# Patient Record
Sex: Male | Born: 1950 | Race: White | Hispanic: No | Marital: Married | Smoking: Current every day smoker
Health system: Southern US, Community
[De-identification: ages and names within clinical notes are randomized; demographics above are authoritative.]

## PROBLEM LIST (undated history)

## (undated) DIAGNOSIS — E119 Type 2 diabetes mellitus without complications: Secondary | ICD-10-CM

## (undated) DIAGNOSIS — J449 Chronic obstructive pulmonary disease, unspecified: Secondary | ICD-10-CM

## (undated) DIAGNOSIS — R062 Wheezing: Secondary | ICD-10-CM

## (undated) DIAGNOSIS — H9190 Unspecified hearing loss, unspecified ear: Secondary | ICD-10-CM

## (undated) DIAGNOSIS — T7840XA Allergy, unspecified, initial encounter: Secondary | ICD-10-CM

## (undated) DIAGNOSIS — R42 Dizziness and giddiness: Secondary | ICD-10-CM

## (undated) DIAGNOSIS — M1990 Unspecified osteoarthritis, unspecified site: Secondary | ICD-10-CM

## (undated) DIAGNOSIS — I219 Acute myocardial infarction, unspecified: Secondary | ICD-10-CM

## (undated) DIAGNOSIS — F32A Depression, unspecified: Secondary | ICD-10-CM

## (undated) DIAGNOSIS — R0601 Orthopnea: Secondary | ICD-10-CM

## (undated) DIAGNOSIS — F329 Major depressive disorder, single episode, unspecified: Secondary | ICD-10-CM

## (undated) DIAGNOSIS — C801 Malignant (primary) neoplasm, unspecified: Secondary | ICD-10-CM

## (undated) DIAGNOSIS — I639 Cerebral infarction, unspecified: Secondary | ICD-10-CM

## (undated) DIAGNOSIS — I2510 Atherosclerotic heart disease of native coronary artery without angina pectoris: Secondary | ICD-10-CM

## (undated) DIAGNOSIS — K219 Gastro-esophageal reflux disease without esophagitis: Secondary | ICD-10-CM

## (undated) HISTORY — PX: CORONARY ANGIOPLASTY: SHX604

## (undated) HISTORY — PX: EYE SURGERY: SHX253

## (undated) HISTORY — PX: ADRENAL GLAND SURGERY: SHX544

## (undated) HISTORY — PX: HAND SURGERY: SHX662

## (undated) HISTORY — DX: Allergy, unspecified, initial encounter: T78.40XA

---

## 2003-02-17 ENCOUNTER — Other Ambulatory Visit: Payer: Self-pay

## 2003-12-28 ENCOUNTER — Emergency Department: Payer: Self-pay | Admitting: Emergency Medicine

## 2004-03-01 ENCOUNTER — Ambulatory Visit: Payer: Self-pay | Admitting: Internal Medicine

## 2004-10-25 ENCOUNTER — Other Ambulatory Visit: Payer: Self-pay

## 2004-10-25 ENCOUNTER — Emergency Department: Payer: Self-pay | Admitting: Emergency Medicine

## 2006-01-06 ENCOUNTER — Ambulatory Visit: Payer: Self-pay | Admitting: *Deleted

## 2006-07-03 ENCOUNTER — Ambulatory Visit: Payer: Self-pay | Admitting: Family Medicine

## 2006-07-04 ENCOUNTER — Ambulatory Visit: Payer: Self-pay | Admitting: Family Medicine

## 2006-07-09 ENCOUNTER — Ambulatory Visit: Payer: Self-pay | Admitting: Family Medicine

## 2006-07-11 ENCOUNTER — Ambulatory Visit: Payer: Self-pay | Admitting: Gastroenterology

## 2006-07-12 ENCOUNTER — Ambulatory Visit: Payer: Self-pay | Admitting: Family Medicine

## 2006-07-18 ENCOUNTER — Ambulatory Visit: Payer: Self-pay | Admitting: Family Medicine

## 2006-08-08 ENCOUNTER — Ambulatory Visit: Payer: Self-pay | Admitting: Family Medicine

## 2006-09-08 ENCOUNTER — Ambulatory Visit: Payer: Self-pay | Admitting: Family Medicine

## 2007-05-12 ENCOUNTER — Emergency Department: Payer: Self-pay | Admitting: Emergency Medicine

## 2007-05-16 ENCOUNTER — Emergency Department: Payer: Self-pay | Admitting: Emergency Medicine

## 2007-05-26 ENCOUNTER — Emergency Department: Payer: Self-pay | Admitting: Emergency Medicine

## 2008-08-19 ENCOUNTER — Ambulatory Visit: Payer: Self-pay | Admitting: Family Medicine

## 2009-02-08 ENCOUNTER — Emergency Department: Payer: Self-pay | Admitting: Emergency Medicine

## 2009-06-09 ENCOUNTER — Ambulatory Visit: Payer: Self-pay | Admitting: Family Medicine

## 2010-04-06 ENCOUNTER — Ambulatory Visit: Payer: Self-pay | Admitting: Family Medicine

## 2010-04-08 ENCOUNTER — Ambulatory Visit: Payer: Self-pay | Admitting: Family Medicine

## 2010-05-09 ENCOUNTER — Ambulatory Visit: Payer: Self-pay | Admitting: Family Medicine

## 2010-06-08 ENCOUNTER — Ambulatory Visit: Payer: Self-pay | Admitting: Family Medicine

## 2010-08-18 DIAGNOSIS — Z720 Tobacco use: Secondary | ICD-10-CM | POA: Insufficient documentation

## 2010-10-12 DIAGNOSIS — E113499 Type 2 diabetes mellitus with severe nonproliferative diabetic retinopathy without macular edema, unspecified eye: Secondary | ICD-10-CM | POA: Insufficient documentation

## 2011-03-15 DIAGNOSIS — I10 Essential (primary) hypertension: Secondary | ICD-10-CM | POA: Insufficient documentation

## 2011-03-15 DIAGNOSIS — I2510 Atherosclerotic heart disease of native coronary artery without angina pectoris: Secondary | ICD-10-CM | POA: Insufficient documentation

## 2011-09-08 DIAGNOSIS — Z0289 Encounter for other administrative examinations: Secondary | ICD-10-CM | POA: Insufficient documentation

## 2012-01-25 DIAGNOSIS — G4733 Obstructive sleep apnea (adult) (pediatric): Secondary | ICD-10-CM | POA: Insufficient documentation

## 2012-05-16 DIAGNOSIS — R918 Other nonspecific abnormal finding of lung field: Secondary | ICD-10-CM | POA: Insufficient documentation

## 2012-05-16 DIAGNOSIS — I252 Old myocardial infarction: Secondary | ICD-10-CM | POA: Insufficient documentation

## 2012-05-16 DIAGNOSIS — I77810 Thoracic aortic ectasia: Secondary | ICD-10-CM | POA: Insufficient documentation

## 2012-09-27 DIAGNOSIS — M1990 Unspecified osteoarthritis, unspecified site: Secondary | ICD-10-CM | POA: Insufficient documentation

## 2013-07-23 DIAGNOSIS — F32A Depression, unspecified: Secondary | ICD-10-CM | POA: Insufficient documentation

## 2013-07-23 DIAGNOSIS — F329 Major depressive disorder, single episode, unspecified: Secondary | ICD-10-CM | POA: Insufficient documentation

## 2014-07-23 NOTE — Patient Outreach (Signed)
[facility]) Care Management  07/23/2014  Sergio Vargas [birth year] [id number]   I called [name] and his wife to schedule appointments.  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 I have discharged him.   Gentry Fitz, RN, BA, MHA, CDE Diabetes Coordinator Inpatient Diabetes Program  [phone] (Team Pager) [phone] ([facility]) 07/23/2014 10:02 AM

## 2014-09-26 DIAGNOSIS — J45909 Unspecified asthma, uncomplicated: Secondary | ICD-10-CM | POA: Insufficient documentation

## 2015-02-28 DIAGNOSIS — J449 Chronic obstructive pulmonary disease, unspecified: Secondary | ICD-10-CM | POA: Diagnosis not present

## 2015-02-28 DIAGNOSIS — G459 Transient cerebral ischemic attack, unspecified: Secondary | ICD-10-CM | POA: Diagnosis not present

## 2015-02-28 DIAGNOSIS — M542 Cervicalgia: Secondary | ICD-10-CM | POA: Diagnosis not present

## 2015-03-20 DIAGNOSIS — J449 Chronic obstructive pulmonary disease, unspecified: Secondary | ICD-10-CM | POA: Insufficient documentation

## 2015-03-31 DIAGNOSIS — J449 Chronic obstructive pulmonary disease, unspecified: Secondary | ICD-10-CM | POA: Diagnosis not present

## 2015-03-31 DIAGNOSIS — G459 Transient cerebral ischemic attack, unspecified: Secondary | ICD-10-CM | POA: Diagnosis not present

## 2015-03-31 DIAGNOSIS — M5030 Other cervical disc degeneration, unspecified cervical region: Secondary | ICD-10-CM | POA: Diagnosis not present

## 2015-03-31 DIAGNOSIS — M542 Cervicalgia: Secondary | ICD-10-CM | POA: Diagnosis not present

## 2015-04-06 ENCOUNTER — Encounter: Payer: Self-pay | Admitting: *Deleted

## 2015-04-07 ENCOUNTER — Encounter: Payer: Self-pay | Admitting: *Deleted

## 2015-04-07 ENCOUNTER — Ambulatory Visit
Admission: RE | Admit: 2015-04-07 | Discharge: 2015-04-07 | Disposition: A | Discharge status: Home / Self Care | Payer: Medicare HMO | Source: Ambulatory Visit | Attending: Ophthalmology | Admitting: Ophthalmology

## 2015-04-07 ENCOUNTER — Ambulatory Visit: Payer: Medicare HMO | Admitting: Anesthesiology

## 2015-04-07 ENCOUNTER — Encounter
Admission: RE | Disposition: A | Discharge status: Home / Self Care | Payer: Self-pay | Source: Ambulatory Visit | Attending: Ophthalmology

## 2015-04-07 DIAGNOSIS — H9190 Unspecified hearing loss, unspecified ear: Secondary | ICD-10-CM | POA: Insufficient documentation

## 2015-04-07 DIAGNOSIS — Z85850 Personal history of malignant neoplasm of thyroid: Secondary | ICD-10-CM | POA: Diagnosis not present

## 2015-04-07 DIAGNOSIS — Z885 Allergy status to narcotic agent status: Secondary | ICD-10-CM | POA: Insufficient documentation

## 2015-04-07 DIAGNOSIS — Z888 Allergy status to other drugs, medicaments and biological substances status: Secondary | ICD-10-CM | POA: Insufficient documentation

## 2015-04-07 DIAGNOSIS — K219 Gastro-esophageal reflux disease without esophagitis: Secondary | ICD-10-CM | POA: Diagnosis not present

## 2015-04-07 DIAGNOSIS — M1990 Unspecified osteoarthritis, unspecified site: Secondary | ICD-10-CM | POA: Diagnosis not present

## 2015-04-07 DIAGNOSIS — J449 Chronic obstructive pulmonary disease, unspecified: Secondary | ICD-10-CM | POA: Insufficient documentation

## 2015-04-07 DIAGNOSIS — Z8673 Personal history of transient ischemic attack (TIA), and cerebral infarction without residual deficits: Secondary | ICD-10-CM | POA: Diagnosis not present

## 2015-04-07 DIAGNOSIS — Z720 Tobacco use: Secondary | ICD-10-CM | POA: Diagnosis not present

## 2015-04-07 DIAGNOSIS — H2512 Age-related nuclear cataract, left eye: Secondary | ICD-10-CM | POA: Insufficient documentation

## 2015-04-07 DIAGNOSIS — I252 Old myocardial infarction: Secondary | ICD-10-CM | POA: Insufficient documentation

## 2015-04-07 DIAGNOSIS — Z955 Presence of coronary angioplasty implant and graft: Secondary | ICD-10-CM | POA: Insufficient documentation

## 2015-04-07 DIAGNOSIS — I2510 Atherosclerotic heart disease of native coronary artery without angina pectoris: Secondary | ICD-10-CM | POA: Diagnosis not present

## 2015-04-07 DIAGNOSIS — I10 Essential (primary) hypertension: Secondary | ICD-10-CM | POA: Diagnosis not present

## 2015-04-07 HISTORY — DX: Dizziness and giddiness: R42

## 2015-04-07 HISTORY — DX: Atherosclerotic heart disease of native coronary artery without angina pectoris: I25.10

## 2015-04-07 HISTORY — DX: Major depressive disorder, single episode, unspecified: F32.9

## 2015-04-07 HISTORY — DX: Cerebral infarction, unspecified: I63.9

## 2015-04-07 HISTORY — DX: Depression, unspecified: F32.A

## 2015-04-07 HISTORY — DX: Unspecified osteoarthritis, unspecified site: M19.90

## 2015-04-07 HISTORY — DX: Orthopnea: R06.01

## 2015-04-07 HISTORY — DX: Chronic obstructive pulmonary disease, unspecified: J44.9

## 2015-04-07 HISTORY — DX: Gastro-esophageal reflux disease without esophagitis: K21.9

## 2015-04-07 HISTORY — PX: CATARACT EXTRACTION W/PHACO: SHX586

## 2015-04-07 HISTORY — DX: Wheezing: R06.2

## 2015-04-07 HISTORY — DX: Malignant (primary) neoplasm, unspecified: C80.1

## 2015-04-07 HISTORY — DX: Unspecified hearing loss, unspecified ear: H91.90

## 2015-04-07 HISTORY — DX: Acute myocardial infarction, unspecified: I21.9

## 2015-04-07 HISTORY — DX: Type 2 diabetes mellitus without complications: E11.9

## 2015-04-07 LAB — GLUCOSE, CAPILLARY
GLUCOSE-CAPILLARY: 312 mg/dL — AB (ref 65–99)
Glucose-Capillary: 367 mg/dL — ABNORMAL HIGH (ref 65–99)
Glucose-Capillary: 327 mg/dL — ABNORMAL HIGH (ref 65–99)

## 2015-04-07 SURGERY — PHACOEMULSIFICATION, CATARACT, WITH IOL INSERTION
Anesthesia: Monitor Anesthesia Care | Site: Eye | Laterality: Left | Wound class: Clean

## 2015-04-07 MED ORDER — POVIDONE-IODINE 5 % OP SOLN
1.0000 "application " | OPHTHALMIC | Status: AC | PRN
  Administered 2015-04-07: 1 via OPHTHALMIC

## 2015-04-07 MED ORDER — TETRACAINE HCL 0.5 % OP SOLN
OPHTHALMIC | Status: AC
  Administered 2015-04-07: 1 [drp] via OPHTHALMIC
  Filled 2015-04-07: qty 2

## 2015-04-07 MED ORDER — ARMC OPHTHALMIC DILATING GEL
OPHTHALMIC | Status: AC
  Administered 2015-04-07: 1 via OPHTHALMIC
  Filled 2015-04-07: qty 0.25

## 2015-04-07 MED ORDER — LIDOCAINE HCL (PF) 4 % IJ SOLN
INTRAOCULAR | Status: DC | PRN
  Administered 2015-04-07: .5 mL via OPHTHALMIC

## 2015-04-07 MED ORDER — EPINEPHRINE HCL 1 MG/ML IJ SOLN
INTRAOCULAR | Status: DC | PRN
  Administered 2015-04-07: 1 mL via OPHTHALMIC

## 2015-04-07 MED ORDER — NA CHONDROIT SULF-NA HYALURON 40-17 MG/ML IO SOLN
INTRAOCULAR | Status: DC | PRN
  Administered 2015-04-07: 1 mL via INTRAOCULAR

## 2015-04-07 MED ORDER — NA CHONDROIT SULF-NA HYALURON 40-17 MG/ML IO SOLN
INTRAOCULAR | Status: AC
  Filled 2015-04-07: qty 1

## 2015-04-07 MED ORDER — INSULIN ASPART 100 UNIT/ML ~~LOC~~ SOLN
SUBCUTANEOUS | Status: AC
  Administered 2015-04-07: 5 [IU] via SUBCUTANEOUS
  Filled 2015-04-07: qty 5

## 2015-04-07 MED ORDER — MIDAZOLAM HCL 2 MG/2ML IJ SOLN
INTRAMUSCULAR | Status: DC | PRN
  Administered 2015-04-07: 1 mg via INTRAVENOUS

## 2015-04-07 MED ORDER — INSULIN ASPART 100 UNIT/ML ~~LOC~~ SOLN
5.0000 [IU] | Freq: Once | SUBCUTANEOUS | Status: AC
  Administered 2015-04-07: 5 [IU] via SUBCUTANEOUS

## 2015-04-07 MED ORDER — TETRACAINE HCL 0.5 % OP SOLN
1.0000 [drp] | OPHTHALMIC | Status: AC | PRN
  Administered 2015-04-07: 1 [drp] via OPHTHALMIC

## 2015-04-07 MED ORDER — LIDOCAINE HCL (PF) 4 % IJ SOLN
INTRAMUSCULAR | Status: AC
  Filled 2015-04-07: qty 5

## 2015-04-07 MED ORDER — INSULIN REGULAR HUMAN 100 UNIT/ML IJ SOLN
4.0000 [IU] | Freq: Once | INTRAMUSCULAR | Status: AC
  Administered 2015-04-07: 4 [IU] via SUBCUTANEOUS
  Filled 2015-04-07: qty 0.04

## 2015-04-07 MED ORDER — MOXIFLOXACIN HCL 0.5 % OP SOLN
OPHTHALMIC | Status: DC | PRN
  Administered 2015-04-07: 1 [drp] via OPHTHALMIC

## 2015-04-07 MED ORDER — MOXIFLOXACIN HCL 0.5 % OP SOLN: 1.0000 [drp] | OPHTHALMIC | Status: DC | PRN

## 2015-04-07 MED ORDER — CEFUROXIME OPHTHALMIC INJECTION 1 MG/0.1 ML
INJECTION | OPHTHALMIC | Status: AC
  Filled 2015-04-07: qty 0.1

## 2015-04-07 MED ORDER — EPINEPHRINE HCL 1 MG/ML IJ SOLN
INTRAMUSCULAR | Status: AC
  Filled 2015-04-07: qty 2

## 2015-04-07 MED ORDER — FENTANYL CITRATE (PF) 100 MCG/2ML IJ SOLN
INTRAMUSCULAR | Status: DC | PRN
  Administered 2015-04-07: 50 ug via INTRAVENOUS

## 2015-04-07 MED ORDER — ARMC OPHTHALMIC DILATING GEL
1.0000 "application " | OPHTHALMIC | Status: DC | PRN
  Administered 2015-04-07: 1 via OPHTHALMIC

## 2015-04-07 MED ORDER — MOXIFLOXACIN HCL 0.5 % OP SOLN
OPHTHALMIC | Status: AC
Start: 2015-04-07 — End: 2015-04-07
  Filled 2015-04-07: qty 3

## 2015-04-07 MED ORDER — CARBACHOL 0.01 % IO SOLN
INTRAOCULAR | Status: DC | PRN
  Administered 2015-04-07: .5 mL via INTRAOCULAR

## 2015-04-07 MED ORDER — SODIUM CHLORIDE 0.9 % IV SOLN
INTRAVENOUS | Status: DC
  Administered 2015-04-07: 08:00:00 via INTRAVENOUS

## 2015-04-07 MED ORDER — POVIDONE-IODINE 5 % OP SOLN
OPHTHALMIC | Status: AC
  Administered 2015-04-07: 1 via OPHTHALMIC
  Filled 2015-04-07: qty 30

## 2015-04-07 MED ORDER — CEFUROXIME OPHTHALMIC INJECTION 1 MG/0.1 ML
INJECTION | OPHTHALMIC | Status: DC | PRN
  Administered 2015-04-07: .1 mL via INTRACAMERAL

## 2015-04-07 SURGICAL SUPPLY — 22 items
CANNULA ANT/CHMB 27GA (MISCELLANEOUS) ×3 IMPLANT
CUP MEDICINE 2OZ PLAST GRAD ST (MISCELLANEOUS) ×3 IMPLANT
GLOVE BIO SURGEON STRL SZ8 (GLOVE) ×3 IMPLANT
GLOVE BIOGEL M 6.5 STRL (GLOVE) ×3 IMPLANT
GLOVE SURG LX 8.0 MICRO (GLOVE) ×2
GLOVE SURG LX STRL 8.0 MICRO (GLOVE) ×1 IMPLANT
GOWN STRL REUS W/ TWL LRG LVL3 (GOWN DISPOSABLE) ×2 IMPLANT
GOWN STRL REUS W/TWL LRG LVL3 (GOWN DISPOSABLE) ×4
LENS IOL TECNIS 22.0 (Intraocular Lens) ×3 IMPLANT
LENS IOL TECNIS MONO 1P 22.0 (Intraocular Lens) ×1 IMPLANT
PACK CATARACT (MISCELLANEOUS) ×3 IMPLANT
PACK CATARACT BRASINGTON LX (MISCELLANEOUS) ×3 IMPLANT
PACK EYE AFTER SURG (MISCELLANEOUS) ×3 IMPLANT
SOL BSS BAG (MISCELLANEOUS) ×3
SOL PREP PVP 2OZ (MISCELLANEOUS) ×3
SOLUTION BSS BAG (MISCELLANEOUS) ×1 IMPLANT
SOLUTION PREP PVP 2OZ (MISCELLANEOUS) ×1 IMPLANT
SYR 3ML LL SCALE MARK (SYRINGE) ×3 IMPLANT
SYR 5ML LL (SYRINGE) ×3 IMPLANT
SYR TB 1ML 27GX1/2 LL (SYRINGE) ×3 IMPLANT
WATER STERILE IRR 1000ML POUR (IV SOLUTION) ×3 IMPLANT
WIPE NON LINTING 3.25X3.25 (MISCELLANEOUS) ×3 IMPLANT

## 2015-04-07 NOTE — Discharge Instructions (Signed)
AMBULATORY SURGERY  °DISCHARGE INSTRUCTIONS ° ° °1) The drugs that you were given will stay in your system until tomorrow so for the next 24 hours you should not: ° °A) Drive an automobile °B) Make any legal decisions °C) Drink any alcoholic beverage ° ° °2) You may resume regular meals tomorrow.  Today it is better to start with liquids and gradually work up to solid foods. ° °You may eat anything you prefer, but it is better to start with liquids, then soup and crackers, and gradually work up to solid foods. ° ° °3) Please notify your doctor immediately if you have any unusual bleeding, trouble breathing, redness and pain at the surgery site, drainage, fever, or pain not relieved by medication. ° ° ° °4) Additional Instructions: ° ° ° °Eye Surgery Discharge Instructions ° °Expect mild scratchy sensation or mild soreness. °DO NOT RUB YOUR EYE! ° °The day of surgery: °• Minimal physical activity, but bed rest is not required °• No reading, computer work, or close hand work °• No bending, lifting, or straining. °• May watch TV ° °For 24 hours: °• No driving, legal decisions, or alcoholic beverages °• Safety precautions °• Eat anything you prefer: It is better to start with liquids, then soup then solid foods. °• _____ Eye patch should be worn until postoperative exam tomorrow. °• ____ Solar shield eyeglasses should be worn for comfort in the sunlight/patch while sleeping ° °Resume all regular medications including aspirin or Coumadin if these were discontinued prior to surgery. °You may shower, bathe, shave, or wash your hair. °Tylenol may be taken for mild discomfort. ° °Call your doctor if you experience significant pain, nausea, or vomiting, fever > 101 or other signs of infection. [phone] or [phone] °Specific instructions: ° ° ° ° ° °Please contact your physician with any problems or Same Day Surgery at [phone], Monday through Friday 6 am to 4 pm, or [place] [facility] [place] Main number at  [phone]. °

## 2015-04-07 NOTE — Transfer of Care (Signed)
Immediate Anesthesia Transfer of Care Note  Patient: Sergio Vargas  Procedure(s) Performed: Procedure(s) with comments: CATARACT EXTRACTION PHACO AND INTRAOCULAR LENS PLACEMENT (IOC) (Left) - [place] 00:41 AP% 22.9 CDE 9.52 fluid pack lot [id number]  Patient Location: PACU  Anesthesia Type:MAC  Level of Consciousness: awake, alert  and oriented  Airway & Oxygen Therapy: Patient Spontanous Breathing  Post-op Assessment: Report given to RN and Post -op Vital signs reviewed and stable  Post vital signs: Reviewed and stable  Last Vitals:  Filed Vitals:   04/07/15 0718  BP: 130/80  Pulse: 84  Temp: 36.5 C  Resp: 18    Complications: No apparent anesthesia complications

## 2015-04-07 NOTE — Anesthesia Preprocedure Evaluation (Addendum)
Anesthesia Evaluation  Patient identified by MRN, date of birth, ID band Patient awake    Reviewed: Allergy & Precautions, H&P , NPO status , Patient's Chart, lab work & pertinent test results, reviewed documented beta blocker date and time   Airway Mallampati: II  TM Distance: >3 FB Neck ROM: full    Dental no notable dental hx. (+) Teeth Intact   Pulmonary neg pulmonary ROS, shortness of breath, with exertion and lying, COPD, Current Smoker,    Pulmonary exam normal breath sounds clear to auscultation       Cardiovascular Exercise Tolerance: Good hypertension, + CAD, + Past MI and + Orthopnea  negative cardio ROS   Rhythm:regular Rate:Normal     Neuro/Psych PSYCHIATRIC DISORDERS CVA negative neurological ROS  negative psych ROS   GI/Hepatic negative GI ROS, Neg liver ROS, GERD  ,  Endo/Other  negative endocrine ROSdiabetes  Renal/GU      Musculoskeletal   Abdominal   Peds  Hematology negative hematology ROS (+)   Anesthesia Other Findings   Reproductive/Obstetrics negative OB ROS                            Anesthesia Physical Anesthesia Plan  ASA: IV  Anesthesia Plan: MAC   Post-op Pain Management:    Induction:   Airway Management Planned:   Additional Equipment:   Intra-op Plan:   Post-operative Plan:   Informed Consent: I have reviewed the patients History and Physical, chart, labs and discussed the procedure including the risks, benefits and alternatives for the proposed anesthesia with the patient or authorized representative who has indicated his/her understanding and acceptance.     Plan Discussed with: CRNA  Anesthesia Plan Comments: (Treating BS with sq insulin.  Baseline orthopnea addressed.JA)       Anesthesia Quick Evaluation

## 2015-04-07 NOTE — Op Note (Signed)
PREOPERATIVE DIAGNOSIS:  Nuclear sclerotic cataract of the left eye.   POSTOPERATIVE DIAGNOSIS:  nuclear sclerotic cataract left eye   OPERATIVE PROCEDURE:  Procedure(s): CATARACT EXTRACTION PHACO AND INTRAOCULAR LENS PLACEMENT (IOC)   SURGEON:  Birder Robson, MD.   ANESTHESIA:   Anesthesiologist: Molli Barrows, MD CRNA: Lance Muss, CRNA  1.      Managed anesthesia care. 2.      Topical tetracaine drops followed by 2% Xylocaine jelly applied in the preoperative holding area.   COMPLICATIONS:  None.   TECHNIQUE:   Stop and chop   DESCRIPTION OF PROCEDURE:  The patient was examined and consented in the preoperative holding area where the aforementioned topical anesthesia was applied to the left eye and then brought back to the Operating Room where the left eye was prepped and draped in the usual sterile ophthalmic fashion and a lid speculum was placed. A paracentesis was created with the side port blade and the anterior chamber was filled with viscoelastic. A near clear corneal incision was performed with the steel keratome. A continuous curvilinear capsulorrhexis was performed with a cystotome followed by the capsulorrhexis forceps. Hydrodissection and hydrodelineation were carried out with BSS on a blunt cannula. The lens was removed in a stop and chop  technique and the remaining cortical material was removed with the irrigation-aspiration handpiece. The capsular bag was inflated with viscoelastic and the Technis [id number] lens was placed in the capsular bag without complication. The remaining viscoelastic was removed from the eye with the irrigation-aspiration handpiece. The wounds were hydrated. The anterior chamber was flushed with Miostat and the eye was inflated to physiologic pressure. 0.1 mL of cefuroxime concentration 10 mg/mL was placed in the anterior chamber. The wounds were found to be water tight. The eye was dressed with Vigamox. The patient was given protective glasses to wear  throughout the day and a shield with which to sleep tonight. The patient was also given drops with which to begin a drop regimen today and will follow-up with me in one day.  Implant Name Type Inv. Item Serial No. Manufacturer Lot No. LRB No. Used  LENS IOL TECNIS 22.0 - [id number] Intraocular Lens LENS IOL TECNIS 22.0 [id number]   Left 1   Procedure(s) with comments: CATARACT EXTRACTION PHACO AND INTRAOCULAR LENS PLACEMENT (IOC) (Left) - [place] 00:41 AP% 22.9 CDE 9.52 fluid pack lot [id number]  Electronically signed: [place] 04/07/2015 9:23 AM

## 2015-04-07 NOTE — H&P (Signed)
All labs reviewed. Abnormal studies sent to patients PCP when indicated.  Previous H&P reviewed, patient examined, there are NO CHANGES.  Sergio Vargas,Camron LOUIS2/28/20178:19 AM

## 2015-04-07 NOTE — Anesthesia Postprocedure Evaluation (Signed)
Anesthesia Post Note  Patient: Sergio Vargas  Procedure(s) Performed: Procedure(s) (LRB): CATARACT EXTRACTION PHACO AND INTRAOCULAR LENS PLACEMENT (IOC) (Left)  Patient location during evaluation: PACU Anesthesia Type: MAC Level of consciousness: awake, oriented and awake and alert Pain management: pain level controlled Vital Signs Assessment: post-procedure vital signs reviewed and stable Respiratory status: spontaneous breathing Cardiovascular status: stable Postop Assessment: no backache Anesthetic complications: no    Last Vitals:  Filed Vitals:   04/07/15 0718  BP: 130/80  Pulse: 84  Temp: 36.5 C  Resp: 18    Last Pain: There were no vitals filed for this visit.               [employer]

## 2015-04-28 DIAGNOSIS — J449 Chronic obstructive pulmonary disease, unspecified: Secondary | ICD-10-CM | POA: Diagnosis not present

## 2015-04-28 DIAGNOSIS — G459 Transient cerebral ischemic attack, unspecified: Secondary | ICD-10-CM | POA: Diagnosis not present

## 2015-04-28 DIAGNOSIS — M542 Cervicalgia: Secondary | ICD-10-CM | POA: Diagnosis not present

## 2015-05-14 DIAGNOSIS — R32 Unspecified urinary incontinence: Secondary | ICD-10-CM | POA: Insufficient documentation

## 2015-05-29 DIAGNOSIS — J449 Chronic obstructive pulmonary disease, unspecified: Secondary | ICD-10-CM | POA: Diagnosis not present

## 2015-05-29 DIAGNOSIS — M542 Cervicalgia: Secondary | ICD-10-CM | POA: Diagnosis not present

## 2015-05-29 DIAGNOSIS — G459 Transient cerebral ischemic attack, unspecified: Secondary | ICD-10-CM | POA: Diagnosis not present

## 2015-05-29 DIAGNOSIS — M5030 Other cervical disc degeneration, unspecified cervical region: Secondary | ICD-10-CM | POA: Diagnosis not present

## 2015-06-03 DIAGNOSIS — R269 Unspecified abnormalities of gait and mobility: Secondary | ICD-10-CM | POA: Insufficient documentation

## 2015-06-09 ENCOUNTER — Ambulatory Visit: Payer: Medicare HMO

## 2015-06-11 ENCOUNTER — Encounter: Payer: Medicare HMO | Admitting: Pharmacist

## 2015-06-18 ENCOUNTER — Encounter: Payer: Medicare HMO | Admitting: Pharmacist

## 2015-06-28 DIAGNOSIS — M542 Cervicalgia: Secondary | ICD-10-CM | POA: Diagnosis not present

## 2015-06-28 DIAGNOSIS — M5030 Other cervical disc degeneration, unspecified cervical region: Secondary | ICD-10-CM | POA: Diagnosis not present

## 2015-06-28 DIAGNOSIS — J449 Chronic obstructive pulmonary disease, unspecified: Secondary | ICD-10-CM | POA: Diagnosis not present

## 2015-06-28 DIAGNOSIS — G459 Transient cerebral ischemic attack, unspecified: Secondary | ICD-10-CM | POA: Diagnosis not present

## 2015-07-29 DIAGNOSIS — G459 Transient cerebral ischemic attack, unspecified: Secondary | ICD-10-CM | POA: Diagnosis not present

## 2015-07-29 DIAGNOSIS — J449 Chronic obstructive pulmonary disease, unspecified: Secondary | ICD-10-CM | POA: Diagnosis not present

## 2015-07-29 DIAGNOSIS — M542 Cervicalgia: Secondary | ICD-10-CM | POA: Diagnosis not present

## 2015-08-13 ENCOUNTER — Encounter: Payer: Medicare HMO | Admitting: Pharmacist

## 2015-12-10 ENCOUNTER — Encounter (INDEPENDENT_AMBULATORY_CARE_PROVIDER_SITE_OTHER): Payer: Self-pay

## 2015-12-10 ENCOUNTER — Ambulatory Visit: Payer: Medicare HMO | Admitting: Pharmacist

## 2015-12-10 ENCOUNTER — Encounter: Payer: Self-pay | Admitting: Pharmacist

## 2015-12-10 VITALS — BP 139/79

## 2015-12-10 DIAGNOSIS — Z79899 Other long term (current) drug therapy: Secondary | ICD-10-CM

## 2015-12-10 NOTE — Patient Instructions (Signed)
Schedule appt with Sergio/Sergio Vargas for Part D plan during open enrollment

## 2015-12-10 NOTE — Progress Notes (Addendum)
  Medication Management Clinic Visit Note  Patient: Sergio Vargas MRN: [id number] Date of Birth: 01-11-1951 PCP: [facility] Internal Medicine (sees residents)   CACEY BARTZ 65 y.o. male presents for a 3 mo. MTM f/u visit. Last visit to PCP was May 2017, next visit will be Jan 2018.  There were no vitals taken for this visit.   BP 139/79  Patient Information   Past Medical History:  Diagnosis Date  . Arthritis   . Cancer (HCC)    THYROID  . COPD (chronic obstructive pulmonary disease) ([place])   . Coronary artery disease   . Depression   . Diabetes mellitus without complication ([place])   . GERD (gastroesophageal reflux disease)   . HOH (hard of hearing)   . Myocardial infarction   . Orthopnea   . Stroke ([place])   . Vertigo   . Wheezing       Past Surgical History:  Procedure Laterality Date  . ADRENAL GLAND SURGERY    . CATARACT EXTRACTION W/PHACO Left 04/07/2015   Procedure: CATARACT EXTRACTION PHACO AND INTRAOCULAR LENS PLACEMENT (IOC);  Surgeon: Birder Robson, MD;  Location: [facility] ORS;  Service: Ophthalmology;  Laterality: Left;  [place] 00:41 AP% 22.9 CDE 9.52 fluid pack lot [id number]  . CORONARY ANGIOPLASTY    . HAND SURGERY      No family history on file.  New Diagnoses (since last visit): none  Family Support: very good, wife who is his primary CG picks up medications and attends all visits   Diet: Pt has decreased sugar intake, cut back on honeyubuns Discussed importance       Pertinent Labs: 06/15/15: A1C 11.1 09/2015: Pt reported about 9  06/15/15: Scr 0.82 Last Lipid Panel 09/2015 Last LFTs: 09/2015   Checks BG: once daily, usually in the am but sometimes in the pm Pt reports usually runs 170s     History  Alcohol Use No      History  Smoking Status  . Current Every Day Smoker  . Types: Cigars  Smokeless Tobacco  . Not on file      Health Maintenance  Topic Date Due  . HEMOGLOBIN A1C  25-Feb-1950  . Hepatitis C Screening   1950/05/16  . PNEUMOCOCCAL POLYSACCHARIDE VACCINE (1) 05/24/1954  . FOOT EXAM  05/24/1962  . OPHTHALMOLOGY EXAM  05/24/1962  . HIV Screening  05/24/1967  . TETANUS/TDAP  05/24/1971  . COLONOSCOPY  05/24/2002  . ZOSTAVAX  05/23/2012  . INFLUENZA VACCINE  09/08/2015     Assessment and Plan:  Continue current Medication regimen.   Pt will need appt. For Part D during open enrollment since he went into the gap early this year in May.

## 2015-12-14 ENCOUNTER — Encounter: Payer: Medicare HMO | Admitting: Pharmacist

## 2016-01-04 ENCOUNTER — Encounter: Payer: Medicare HMO | Admitting: Pharmacist

## 2016-02-09 DIAGNOSIS — L570 Actinic keratosis: Secondary | ICD-10-CM | POA: Insufficient documentation

## 2016-08-26 DIAGNOSIS — H4311 Vitreous hemorrhage, right eye: Secondary | ICD-10-CM | POA: Insufficient documentation

## 2016-09-20 DIAGNOSIS — M75121 Complete rotator cuff tear or rupture of right shoulder, not specified as traumatic: Secondary | ICD-10-CM | POA: Insufficient documentation

## 2017-08-11 DIAGNOSIS — E119 Type 2 diabetes mellitus without complications: Secondary | ICD-10-CM | POA: Insufficient documentation

## 2017-08-18 DIAGNOSIS — H2511 Age-related nuclear cataract, right eye: Secondary | ICD-10-CM | POA: Insufficient documentation

## 2017-08-18 DIAGNOSIS — H3582 Retinal ischemia: Secondary | ICD-10-CM | POA: Insufficient documentation

## 2017-09-05 DIAGNOSIS — J984 Other disorders of lung: Secondary | ICD-10-CM | POA: Insufficient documentation

## 2017-12-28 DIAGNOSIS — H348112 Central retinal vein occlusion, right eye, stable: Secondary | ICD-10-CM | POA: Insufficient documentation

## 2017-12-29 DIAGNOSIS — G459 Transient cerebral ischemic attack, unspecified: Secondary | ICD-10-CM | POA: Insufficient documentation

## 2018-01-09 DIAGNOSIS — K5900 Constipation, unspecified: Secondary | ICD-10-CM | POA: Insufficient documentation

## 2018-03-09 ENCOUNTER — Ambulatory Visit
Admission: EM | Admit: 2018-03-09 | Discharge: 2018-03-09 | Disposition: A | Discharge status: Home / Self Care | Payer: Medicare HMO | Attending: Family Medicine | Admitting: Family Medicine

## 2018-03-09 DIAGNOSIS — R031 Nonspecific low blood-pressure reading: Secondary | ICD-10-CM | POA: Diagnosis not present

## 2018-03-09 DIAGNOSIS — R21 Rash and other nonspecific skin eruption: Secondary | ICD-10-CM | POA: Diagnosis not present

## 2018-03-09 MED ORDER — TRIAMCINOLONE ACETONIDE 0.5 % EX OINT: 1.0000 "application " | TOPICAL_OINTMENT | Freq: Two times a day (BID) | CUTANEOUS | 0 refills | Status: AC

## 2018-03-09 NOTE — Discharge Instructions (Signed)
You need to see a dermatologist. I recommend call [facility] dermatology.  Use the medication as prescribed.  Take care  Dr. Lacinda Axon

## 2018-03-09 NOTE — ED Provider Notes (Signed)
[facility]    CSN: [id number] Arrival date & time: 03/09/18  1606  History   Chief Complaint Chief Complaint  Patient presents with  . Rash   HPI   68 year old male presents with rash.  Patient reports a 3 to 4-day history of rash.  Rash is located behind the knee.  Right greater than left.  He states that it is red and itchy.  He has been scratching intensely.  He has used over-the-counter hydrocortisone without relief.  No new exposures.  No known inciting factor.  No known exacerbating factors.  No other associated symptoms.  No other complaints.  Past Medical History:  Diagnosis Date  . Allergy   . Arthritis   . Cancer (HCC)    THYROID  . COPD (chronic obstructive pulmonary disease) ([place])   . Coronary artery disease   . Depression   . Diabetes mellitus without complication ([place])   . GERD (gastroesophageal reflux disease)   . HOH (hard of hearing)   . Myocardial infarction ([place])   . Orthopnea   . Stroke ([place])   . Vertigo   . Wheezing     Patient Active Problem List   Diagnosis Date Noted  . Abnormal gait 06/03/2015  . Incontinence 05/14/2015  . Chronic obstructive pulmonary disease ([place]) 03/20/2015  . Clinical depression 07/23/2013  . Arthritis, degenerative 09/27/2012  . H/O acute myocardial infarction 05/16/2012  . Lung nodule, multiple 05/16/2012  . Aortic root dilatation ([place]) 05/16/2012  . Obstructive apnea 01/25/2012  . BP (high blood pressure) 03/15/2011  . Severe nonproliferative diabetic retinopathy ([place]) 10/12/2010  . Current tobacco use 08/18/2010    Past Surgical History:  Procedure Laterality Date  . ADRENAL GLAND SURGERY    . CATARACT EXTRACTION W/PHACO Left 04/07/2015   Procedure: CATARACT EXTRACTION PHACO AND INTRAOCULAR LENS PLACEMENT (IOC);  Surgeon: Birder Robson, MD;  Location: [facility] ORS;  Service: Ophthalmology;  Laterality: Left;  [place] 00:41 AP% 22.9 CDE 9.52 fluid pack lot #[id number]  . CORONARY ANGIOPLASTY    . EYE  SURGERY    . HAND SURGERY         Home Medications    Prior to Admission medications   Medication Sig Start Date End Date Taking? Authorizing Provider  aspirin EC 81 MG tablet Take 81 mg by mouth daily. 08/18/10  Yes [provider]  atorvastatin (LIPITOR) 80 MG tablet Take 80 mg by mouth 2 (two) times daily. At 6pm 04/02/15  Yes [provider]  clobetasol ointment (TEMOVATE) 1.44 % Apply 1 application topically as needed. 05/22/14  Yes [provider]  docusate sodium (STOOL SOFTENER) 100 MG capsule Take 1 capsule by mouth 2 (two) times daily. As needed 09/27/12  Yes [provider]  fluticasone (FLONASE) 50 MCG/ACT nasal spray Place 2 sprays into both nostrils daily. 06/15/15  Yes [provider]  insulin NPH Human (HUMULIN N,NOVOLIN N) 100 UNIT/ML injection Take 50 U in AM and 50U in PM 07/19/17  Yes [provider]  meclizine (ANTIVERT) 25 MG tablet Take 25 mg by mouth 3 (three) times daily.   Yes [provider]  metFORMIN (GLUCOPHAGE) 1000 MG tablet Take 1,000 mg by mouth 2 (two) times daily. 04/02/15  Yes [provider]  nitroGLYCERIN (NITROSTAT) 0.4 MG SL tablet Place 0.4 mg under the tongue as needed for chest pain. 08/18/10  Yes [provider]  oxyCODONE-acetaminophen (PERCOCET) 10-325 MG tablet Take 1 tablet by mouth every 4 (four) hours while awake. 08/10/15  Yes [provider]  pantoprazole (PROTONIX) 20 MG tablet Take 20 mg by mouth daily. 06/15/15 03/09/18 Yes [provider]  venlafaxine XR (EFFEXOR-XR) 75 MG 24 hr capsule Take 150 mg by mouth daily. 04/02/15  Yes [provider]  Artificial Tear Ointment (ARTIFICIAL TEARS) ointment Place 1 application into the left eye 3 (three) times daily. 09/03/14   [provider]  Fluticasone-Salmeterol (ADVAIR DISKUS) 250-50 MCG/DOSE AEPB Inhale 1 puff into the lungs 2 (two) times daily. 06/15/15 12/09/16  [provider]    gabapentin (NEURONTIN) 100 MG capsule Take 100 mg by mouth 2 (two) times daily. 09/15/15   [provider]  glipiZIDE (GLUCOTROL XL) 5 MG 24 hr tablet  01/09/18   [provider]  insulin glargine (LANTUS) 100 UNIT/ML injection Inject 60 Units into the skin 2 (two) times daily.    [provider]  triamcinolone ointment (KENALOG) 0.5 % Apply 1 application topically 2 (two) times daily. 03/09/18   Coral Spikes, DO  albuterol (PROVENTIL HFA;VENTOLIN HFA) 108 (90 Base) MCG/ACT inhaler Inhale 2 puffs into the lungs every 4 (four) hours while awake. 06/15/15 09/13/15  [provider]    Family History Family History  Problem Relation Age of Onset  . Heart disease Mother   . Diabetes Father   . AAA (abdominal aortic aneurysm) Brother     Social History Social History   Tobacco Use  . Smoking status: Current Every Day Smoker    Packs/day: 1.00    Years: 55.00    Pack years: 55.00    Types: Cigars  . Smokeless tobacco: Never Used  Substance Use Topics  . Alcohol use: No  . Drug use: Not on file     Allergies   Morphine and related and Morpholine salicylate   Review of Systems Review of Systems  Constitutional: Negative.   Skin: Positive for rash.   Physical Exam Triage Vital Signs ED Triage Vitals  Enc Vitals Group     BP 03/09/18 1618 (!) 81/59     Pulse Rate 03/09/18 1618 (!) 113     Resp 03/09/18 1618 18     Temp 03/09/18 1618 (!) 97.4 F (36.3 C)     Temp Source 03/09/18 1618 Oral     SpO2 03/09/18 1618 100 %     Weight 03/09/18 1619 215 lb (97.5 kg)     Height 03/09/18 1619 5\' 10"  (1.778 m)     Head Circumference --      Peak Flow --      Pain Score 03/09/18 1619 5     Pain Loc --      Pain Edu? --      Excl. in [place]? --    Updated Vital Signs BP (!) 81/59 (BP Location: Left Arm)   Pulse (!) 113   Temp (!) 97.4 F (36.3 C) (Oral)   Resp 18   Ht 5\' 10"  (1.778 m)   Wt 97.5 kg   SpO2 100%   BMI 30.85 kg/m   Visual  Acuity Right Eye Distance:   Left Eye Distance:   Bilateral Distance:    Right Eye Near:   Left Eye Near:    Bilateral Near:     Physical Exam Vitals signs and nursing note reviewed.  Constitutional:      General: He is not in acute distress.    Appearance: Normal appearance.  HENT:     Head: Normocephalic and atraumatic.     Nose: Nose  normal.  Eyes:     General: No scleral icterus.    Conjunctiva/sclera: Conjunctivae normal.  Pulmonary:     Effort: Pulmonary effort is normal. No respiratory distress.  Skin:    Comments: Popliteal fossa with erythematous, raised rash.  Does not blanch.  See picture.  Neurological:     Mental Status: He is alert.  Psychiatric:        Mood and Affect: Mood normal.        Behavior: Behavior normal.     UC Treatments / Results  Labs (all labs ordered are listed, but only abnormal results are displayed) Labs Reviewed - No data to display  EKG None  Radiology No results found.  Procedures Procedures (including critical care time)  Medications Ordered in UC Medications - No data to display  Initial Impression / Assessment and Plan / UC Course  I have reviewed the triage vital signs and the nursing notes.  Pertinent labs & imaging results that were available during my care of the patient were reviewed by me and considered in my medical decision making (see chart for details).    68 year old male presents with rash.  Etiology uncertain at this time.  Possible vasculitis but does not blanch and is quite itchy.  I am treating him with topical steroids.  Advised him to see dermatology if he fails to improve or worsens.  Final Clinical Impressions(s) / UC Diagnoses   Final diagnoses:  Rash     Discharge Instructions     You need to see a dermatologist. I recommend call [facility] dermatology.  Use the medication as prescribed.  Take care  Dr. Lacinda Axon    ED Prescriptions    Medication Sig Dispense Auth. Provider   triamcinolone  ointment (KENALOG) 0.5 % Apply 1 application topically 2 (two) times daily. 30 g Coral Spikes, DO     Controlled Substance Prescriptions [place] Controlled Substance Registry consulted? Not Applicable   Coral Spikes, DO 03/09/18 1726

## 2018-03-09 NOTE — ED Triage Notes (Signed)
Pt here for rash on the back of his right leg that started a week ago with itching and has now spread to his left leg. Has tried hydrocortisone without relief.

## 2018-05-24 DIAGNOSIS — E042 Nontoxic multinodular goiter: Secondary | ICD-10-CM | POA: Insufficient documentation

## 2019-06-06 ENCOUNTER — Ambulatory Visit: Payer: Medicare HMO | Attending: Internal Medicine

## 2019-06-06 DIAGNOSIS — Z23 Encounter for immunization: Secondary | ICD-10-CM

## 2019-06-06 NOTE — Progress Notes (Signed)
   Covid-19 Vaccination Clinic  Name:  Sergio Vargas    MRN: [id number] DOB: Mar 28, 1950  06/06/2019  Mr. Sergio Vargas was observed post Covid-19 immunization for 15 minutes without incident. He was provided with Vaccine Information Sheet and instruction to access the V-Safe system.   Mr. Sergio Vargas was instructed to call 911 with any severe reactions post vaccine: Marland Kitchen Difficulty breathing  . Swelling of face and throat  . A fast heartbeat  . A bad rash all over body  . Dizziness and weakness   Immunizations Administered    Name Date Dose VIS Date Route   Pfizer COVID-19 Vaccine 06/06/2019 10:53 AM 0.3 mL 04/03/2018 Intramuscular   Manufacturer: [place]   Lot: [id number]   [place]: [id number]

## 2019-06-25 ENCOUNTER — Ambulatory Visit: Payer: Medicare HMO | Attending: Internal Medicine

## 2019-06-29 ENCOUNTER — Ambulatory Visit: Payer: Medicare HMO | Attending: Internal Medicine

## 2019-06-29 DIAGNOSIS — Z23 Encounter for immunization: Secondary | ICD-10-CM

## 2019-06-29 NOTE — Progress Notes (Signed)
   Covid-19 Vaccination Clinic  Name:  Sergio Vargas    MRN: [id number] DOB: July 13, 1950  06/29/2019  Sergio Vargas was observed post Covid-19 immunization for 30 minutes based on pre-vaccination screening without incident. He was provided with Vaccine Information Sheet and instruction to access the V-Safe system.   Sergio Vargas was instructed to call 911 with any severe reactions post vaccine: Marland Kitchen Difficulty breathing  . Swelling of face and throat  . A fast heartbeat  . A bad rash all over body  . Dizziness and weakness   Immunizations Administered    Name Date Dose VIS Date Route   Pfizer COVID-19 Vaccine 06/29/2019  9:52 AM 0.3 mL 04/03/2018 Intramuscular   Manufacturer: [place]   Lot: [id number]   [place]: [id number]

## 2019-07-15 ENCOUNTER — Other Ambulatory Visit: Payer: Self-pay | Admitting: Physician Assistant

## 2019-07-15 ENCOUNTER — Telehealth: Payer: Self-pay | Admitting: Physician Assistant

## 2019-07-15 DIAGNOSIS — M25511 Pain in right shoulder: Secondary | ICD-10-CM

## 2019-07-15 NOTE — Telephone Encounter (Signed)
07/15/19-LVM on home VM. [employer] has no vm. MF

## 2019-07-29 ENCOUNTER — Ambulatory Visit: Admission: RE | Admit: 2019-07-29 | Payer: Medicare HMO | Source: Ambulatory Visit

## 2019-10-21 DIAGNOSIS — R42 Dizziness and giddiness: Secondary | ICD-10-CM | POA: Insufficient documentation

## 2019-12-04 DIAGNOSIS — I671 Cerebral aneurysm, nonruptured: Secondary | ICD-10-CM | POA: Insufficient documentation

## 2020-01-21 ENCOUNTER — Other Ambulatory Visit (INDEPENDENT_AMBULATORY_CARE_PROVIDER_SITE_OTHER): Payer: Self-pay | Admitting: Vascular Surgery

## 2020-01-21 DIAGNOSIS — I739 Peripheral vascular disease, unspecified: Secondary | ICD-10-CM

## 2020-01-21 DIAGNOSIS — E11621 Type 2 diabetes mellitus with foot ulcer: Secondary | ICD-10-CM

## 2020-01-21 DIAGNOSIS — L97509 Non-pressure chronic ulcer of other part of unspecified foot with unspecified severity: Secondary | ICD-10-CM

## 2020-01-23 ENCOUNTER — Other Ambulatory Visit: Payer: Self-pay

## 2020-01-23 ENCOUNTER — Ambulatory Visit (INDEPENDENT_AMBULATORY_CARE_PROVIDER_SITE_OTHER): Payer: Medicare HMO

## 2020-01-23 ENCOUNTER — Ambulatory Visit (INDEPENDENT_AMBULATORY_CARE_PROVIDER_SITE_OTHER): Payer: Medicare HMO | Admitting: Vascular Surgery

## 2020-01-23 ENCOUNTER — Encounter (INDEPENDENT_AMBULATORY_CARE_PROVIDER_SITE_OTHER): Payer: Self-pay | Admitting: Vascular Surgery

## 2020-01-23 VITALS — BP 121/69 | HR 94 | Resp 16 | Ht 70.0 in | Wt 209.0 lb

## 2020-01-23 DIAGNOSIS — I252 Old myocardial infarction: Secondary | ICD-10-CM

## 2020-01-23 DIAGNOSIS — E1159 Type 2 diabetes mellitus with other circulatory complications: Secondary | ICD-10-CM | POA: Diagnosis not present

## 2020-01-23 DIAGNOSIS — I7025 Atherosclerosis of native arteries of other extremities with ulceration: Secondary | ICD-10-CM

## 2020-01-23 DIAGNOSIS — L97509 Non-pressure chronic ulcer of other part of unspecified foot with unspecified severity: Secondary | ICD-10-CM

## 2020-01-23 DIAGNOSIS — I739 Peripheral vascular disease, unspecified: Secondary | ICD-10-CM | POA: Diagnosis not present

## 2020-01-23 DIAGNOSIS — E11621 Type 2 diabetes mellitus with foot ulcer: Secondary | ICD-10-CM

## 2020-01-23 DIAGNOSIS — I10 Essential (primary) hypertension: Secondary | ICD-10-CM | POA: Diagnosis not present

## 2020-01-23 NOTE — Progress Notes (Signed)
MRN : [id number]  Sergio Vargas is a 69 y.o. (1950/02/13) male who presents with chief complaint of No chief complaint on file. Marland Kitchen  History of Present Illness:   The patient is seen for evaluation of painful lower extremities and diminished pulses associated with ulceration of the foot.  The patient notes the ulcer has been present for multiple weeks and has not been improving.  It is very painful and has had some drainage.  No specific history of trauma noted by the patient.  The patient denies fever or chills.  the patient does have diabetes which has been difficult to control.  Patient notes prior to the ulcer developing the extremities were painful particularly with ambulation or activity and the discomfort is very consistent day today. Typically, the pain occurs at less than one block, progress is as activity continues to the point that the patient must stop walking. Resting including standing still for several minutes allowed resumption of the activity and the ability to walk a similar distance before stopping again. Uneven terrain and inclined shorten the distance. The pain has been progressive over the past several years.   The patient denies rest pain or dangling of an extremity off the side of the bed during the night for relief. No prior interventions or surgeries.  No history of back problems or DJD of the lumbar sacral spine.   The patient denies amaurosis fugax or recent TIA symptoms. There are no recent neurological changes noted. The patient denies history of DVT, PE or superficial thrombophlebitis. The patient denies recent episodes of angina or shortness of breath.   ABI's RT=1.02 and Lt=0.89  No outpatient medications have been marked as taking for the 01/23/20 encounter (Appointment) with Delana Meyer, Dolores Lory, MD.    Past Medical History:  Diagnosis Date  . Allergy   . Arthritis   . Cancer (HCC)    THYROID  . COPD (chronic obstructive pulmonary disease) ([place])    . Coronary artery disease   . Depression   . Diabetes mellitus without complication ([place])   . GERD (gastroesophageal reflux disease)   . HOH (hard of hearing)   . Myocardial infarction ([place])   . Orthopnea   . Stroke ([place])   . Vertigo   . Wheezing     Past Surgical History:  Procedure Laterality Date  . ADRENAL GLAND SURGERY    . CATARACT EXTRACTION W/PHACO Left 04/07/2015   Procedure: CATARACT EXTRACTION PHACO AND INTRAOCULAR LENS PLACEMENT (IOC);  Surgeon: Birder Robson, MD;  Location: [facility] ORS;  Service: Ophthalmology;  Laterality: Left;  [place] 00:41 AP% 22.9 CDE 9.52 fluid pack lot #[id number]  . CORONARY ANGIOPLASTY    . EYE SURGERY    . HAND SURGERY      Social History Social History   Tobacco Use  . Smoking status: Current Every Day Smoker    Packs/day: 1.00    Years: 55.00    Pack years: 55.00    Types: Cigars  . Smokeless tobacco: Never Used  Vaping Use  . Vaping Use: Never used  Substance Use Topics  . Alcohol use: No    Family History Family History  Problem Relation Age of Onset  . Heart disease Mother   . Diabetes Father   . AAA (abdominal aortic aneurysm) Brother   No family history of bleeding/clotting disorders, porphyria or autoimmune disease   Allergies  Allergen Reactions  . Morphine And Related   . Morpholine Salicylate Nausea And Vomiting  REVIEW OF SYSTEMS (Negative unless checked)  Constitutional: [] Weight loss  [] Fever  [] Chills Cardiac: [] Chest pain   [] Chest pressure   [] Palpitations   [] Shortness of breath when laying flat   [] Shortness of breath with exertion. Vascular:  [x] Pain in legs with walking   [x] Pain in legs at rest  [] History of DVT   [] Phlebitis   [] Swelling in legs   [] Varicose veins   [] Non-healing ulcers Pulmonary:   [] Uses home oxygen   [] Productive cough   [] Hemoptysis   [] Wheeze  [] COPD   [] Asthma Neurologic:  [] Dizziness   [] Seizures   [] History of stroke   [] History of TIA  [] Aphasia   [] Vissual changes    [] Weakness or numbness in arm   [] Weakness or numbness in leg Musculoskeletal:   [] Joint swelling   [x] Joint pain   [] Low back pain Hematologic:  [] Easy bruising  [] Easy bleeding   [] Hypercoagulable state   [] Anemic Gastrointestinal:  [] Diarrhea   [] Vomiting  [] Gastroesophageal reflux/heartburn   [] Difficulty swallowing. Genitourinary:  [] Chronic kidney disease   [] Difficult urination  [] Frequent urination   [] Blood in urine Skin:  [] Rashes   [] Ulcers  Psychological:  [] History of anxiety   []  History of major depression.  Physical Examination  There were no vitals filed for this visit. There is no height or weight on file to calculate BMI. Gen: WD/WN, NAD Head: [place]/AT, No temporalis wasting.  Ear/Nose/Throat: Hearing grossly intact, nares w/o erythema or drainage, poor dentition Eyes: PER, EOMI, sclera nonicteric.  Neck: Supple, no masses.  No bruit or JVD.  Pulmonary:  Good air movement, clear to auscultation bilaterally, no use of accessory muscles.  Cardiac: RRR, normal S1, S2, no Murmurs. Vascular: scattered varicosities present bilaterally.  Mild venous stasis changes to the legs bilaterally.  1+ soft pitting edema. Vessel Right Left  Radial Palpable Palpable  PT Not Palpable Not Palpable  DP Not Palpable Not Palpable  Gastrointestinal: soft, non-distended. No guarding/no peritoneal signs.  Musculoskeletal: M/S 5/5 throughout.  No deformity or atrophy.  Neurologic: CN 2-12 intact. Pain and light touch intact in extremities.  Symmetrical.  Speech is fluent. Motor exam as listed above. Psychiatric: Judgment intact, Mood & affect appropriate for pt's clinical situation. Dermatologic: No rashes or ulcers noted.  No changes consistent with cellulitis.  CBC No results found for: WBC, HGB, HCT, MCV, PLT  BMET No results found for: NA, K, CL, CO2, GLUCOSE, BUN, CREATININE, CALCIUM, GFRNONAA, GFRAA CrCl cannot be calculated (No successful lab value found.).  COAG No results found  for: INR, PROTIME  Radiology No results found.   Assessment/Plan 1. Atherosclerosis of native arteries of the extremities with ulceration ([place])  Recommend:  The patient has evidence of atherosclerosis of the lower extremities with claudication.  The patient does not voice lifestyle limiting changes at this point in time.  Noninvasive studies do not suggest clinically significant change.  No invasive studies, angiography or surgery at this time The patient should continue walking and begin a more formal exercise program.  The patient should continue antiplatelet therapy and aggressive treatment of the lipid abnormalities  No changes in the patient's medications at this time  The patient should continue wearing graduated compression socks 10-15 mmHg strength to control the mild edema.   - VAS [place] LOWER EXTREMITY ARTERIAL DUPLEX; Future - VAS [place] ABI WITH/WO TBI; Future  2. Hypertension, unspecified type Continue antihypertensive medications as already ordered, these medications have been reviewed and there are no changes at this time.   3.  H/O acute myocardial infarction Continue cardiac and antihypertensive medications as already ordered and reviewed, no changes at this time.  Continue statin as ordered and reviewed, no changes at this time  Nitrates PRN for chest pain   4. Type 2 diabetes mellitus with other circulatory complication, unspecified whether long term insulin use (HCC) Continue hypoglycemic medications as already ordered, these medications have been reviewed and there are no changes at this time.  Hgb A1C to be monitored as already arranged by primary service     Hortencia Pilar, MD  01/23/2020 1:07 PM

## 2020-01-25 ENCOUNTER — Encounter (INDEPENDENT_AMBULATORY_CARE_PROVIDER_SITE_OTHER): Payer: Self-pay | Admitting: Vascular Surgery

## 2020-01-30 ENCOUNTER — Other Ambulatory Visit: Payer: Self-pay

## 2020-01-30 ENCOUNTER — Ambulatory Visit (INDEPENDENT_AMBULATORY_CARE_PROVIDER_SITE_OTHER): Payer: Medicare HMO

## 2020-01-30 ENCOUNTER — Encounter: Payer: Self-pay | Admitting: Emergency Medicine

## 2020-01-30 ENCOUNTER — Ambulatory Visit
Admission: EM | Admit: 2020-01-30 | Discharge: 2020-01-30 | Disposition: A | Discharge status: Home / Self Care | Payer: Medicare HMO | Attending: Family Medicine | Admitting: Family Medicine

## 2020-01-30 ENCOUNTER — Ambulatory Visit (INDEPENDENT_AMBULATORY_CARE_PROVIDER_SITE_OTHER)
Admit: 2020-01-30 | Discharge: 2020-01-30 | Disposition: A | Discharge status: Home / Self Care | Payer: Medicare HMO | Attending: Family Medicine | Admitting: Family Medicine

## 2020-01-30 DIAGNOSIS — W19XXXA Unspecified fall, initial encounter: Secondary | ICD-10-CM

## 2020-01-30 DIAGNOSIS — M25551 Pain in right hip: Secondary | ICD-10-CM

## 2020-01-30 DIAGNOSIS — M25552 Pain in left hip: Secondary | ICD-10-CM

## 2020-01-30 DIAGNOSIS — R079 Chest pain, unspecified: Secondary | ICD-10-CM

## 2020-01-30 DIAGNOSIS — S0990XA Unspecified injury of head, initial encounter: Secondary | ICD-10-CM

## 2020-01-30 NOTE — ED Triage Notes (Signed)
Called patient's ins co([employer]) at [phone] for prior auth of CT Head without contrast (CPT [phone]). Spoke with [employer] # [id number] valid 01/30/20 to 02/29/20.

## 2020-01-30 NOTE — ED Provider Notes (Addendum)
[facility]    CSN: [id number] Arrival date & time: 01/30/20  1355      History   Chief Complaint Chief Complaint  Patient presents with  . Fall    DOI 01/28/20  . Chest Pain    rib   HPI  69 year old male presents with the above complaints.  Patient suffered a fall on 12/21. He was in the parking lot at [employer] and fell landing on a curb. He reports head injury. He states that he hit the top of his head. Patient reports that he is having diffuse pain. He is complaining predominantly of rib pain and lateral hip pain. There is no apparent hematoma. Rates his pain a 8/10 in severity. He is taking his home Percocet without resolution. His wife states that he has not been his normal self. He has been a little more confused than his normal baseline. No other reported symptoms. No other complaints.  Past Medical History:  Diagnosis Date  . Allergy   . Arthritis   . Cancer (HCC)    THYROID  . COPD (chronic obstructive pulmonary disease) (HCC)   . Coronary artery disease   . Depression   . Diabetes mellitus without complication (HCC)   . GERD (gastroesophageal reflux disease)   . HOH (hard of hearing)   . Myocardial infarction (HCC)   . Orthopnea   . Stroke (HCC)   . Vertigo   . Wheezing     Patient Active Problem List   Diagnosis Date Noted  . Atherosclerosis of native arteries of the extremities with ulceration (HCC) 01/23/2020  . Diabetes (HCC) 01/23/2020  . Aneurysm of posterior communicating artery 12/04/2019  . Dizziness 10/21/2019  . Multiple thyroid nodules 05/24/2018  . Constipation 01/09/2018  . TIA (transient ischemic attack) 12/29/2017  . Central retinal vein occlusion, right eye, stable 12/28/2017  . Restrictive lung disease 09/05/2017  . Age-related nuclear cataract of right eye 08/18/2017  . Macular ischemia of both eyes 08/18/2017  . Complete tear of right rotator cuff 09/20/2016  . Vitreous hemorrhage of left eye (HCC) 08/26/2016  . AK  (actinic keratosis) 02/09/2016  . Abnormal gait 06/03/2015  . Incontinence 05/14/2015  . Chronic obstructive pulmonary disease (HCC) 03/20/2015  . Asthma 09/26/2014  . Clinical depression 07/23/2013  . Malignant neoplasm of thyroid gland (HCC) 10/29/2012  . Arthritis, degenerative 09/27/2012  . H/O acute myocardial infarction 05/16/2012  . Lung nodule, multiple 05/16/2012  . Aortic root dilatation (HCC) 05/16/2012  . Other nonspecific abnormal finding of lung field 05/16/2012  . Obstructive apnea 01/25/2012  . Atherosclerosis of native arteries of extremity with intermittent claudication (HCC) 12/23/2011  . Pain medication agreement 09/08/2011  . BP (high blood pressure) 03/15/2011  . Eczema 11/29/2010  . Severe nonproliferative diabetic retinopathy (HCC) 10/12/2010  . Current tobacco use 08/18/2010  . Mixed hyperlipidemia 08/18/2010    Past Surgical History:  Procedure Laterality Date  . ADRENAL GLAND SURGERY    . CATARACT EXTRACTION W/PHACO Left 04/07/2015   Procedure: CATARACT EXTRACTION PHACO AND INTRAOCULAR LENS PLACEMENT (IOC);  Surgeon: Galen Manila, MD;  Location: [facility] ORS;  Service: Ophthalmology;  Laterality: Left;  [place] 00:41 AP% 22.9 CDE 9.52 fluid pack lot #[id number]  . CORONARY ANGIOPLASTY    . EYE SURGERY    . HAND SURGERY         Home Medications    Prior to Admission medications   Medication Sig Start Date End Date Taking? Authorizing Provider  albuterol (VENTOLIN HFA) 108 (90  Base) MCG/ACT inhaler Inhale 2 puffs into the lungs every 6 (six) hours as needed for wheezing or shortness of breath.   Yes [provider]  Artificial Tear Ointment (ARTIFICIAL TEARS) ointment Place 1 application into the left eye 3 (three) times daily. 09/03/14  Yes [provider]  aspirin EC 81 MG tablet Take 81 mg by mouth daily. 08/18/10  Yes [provider]  atorvastatin (LIPITOR) 80 MG tablet Take 80 mg by mouth 2 (two) times daily. At 6pm 04/02/15   Yes [provider]  docusate sodium (COLACE) 100 MG capsule Take 1 capsule by mouth 2 (two) times daily. As needed 09/27/12  Yes [provider]  fluticasone (FLONASE) 50 MCG/ACT nasal spray Place 2 sprays into both nostrils daily. 06/15/15  Yes [provider]  glipiZIDE (GLUCOTROL XL) 5 MG 24 hr tablet  01/09/18  Yes [provider]  insulin NPH Human (HUMULIN N,NOVOLIN N) 100 UNIT/ML injection 48 Units. 07/19/17  Yes [provider]  metFORMIN (GLUCOPHAGE) 1000 MG tablet Take 1,000 mg by mouth 2 (two) times daily. 04/02/15  Yes [provider]  nitroGLYCERIN (NITROSTAT) 0.4 MG SL tablet Place 0.4 mg under the tongue as needed for chest pain. 08/18/10  Yes [provider]  oxyCODONE-acetaminophen (PERCOCET) 10-325 MG tablet Take 1 tablet by mouth every 4 (four) hours while awake. 08/10/15  Yes [provider]  triamcinolone ointment (KENALOG) 0.5 % Apply 1 application topically 2 (two) times daily. 03/09/18  Yes Caydon Feasel G, DO  venlafaxine XR (EFFEXOR-XR) 75 MG 24 hr capsule Take 150 mg by mouth daily. 04/02/15  Yes [provider]  clobetasol ointment (TEMOVATE) 0.05 % Apply 1 application topically as needed. 05/22/14   [provider]  Fluticasone-Salmeterol (ADVAIR) 250-50 MCG/DOSE AEPB Inhale 1 puff into the lungs 2 (two) times daily. 06/15/15 01/30/20  [provider]  gabapentin (NEURONTIN) 100 MG capsule Take 100 mg by mouth 2 (two) times daily. Patient not taking: No sig reported 09/15/15   [provider]  insulin glargine (LANTUS) 100 UNIT/ML injection Inject 60 Units into the skin 2 (two) times daily. Patient not taking: No sig reported    [provider]  meclizine (ANTIVERT) 25 MG tablet Take 25 mg by mouth 3 (three) times daily.    [provider]  pantoprazole (PROTONIX) 20 MG tablet Take 20 mg by mouth daily. 06/15/15 03/09/18  [provider]    Family  History Family History  Problem Relation Age of Onset  . Heart disease Mother   . Diabetes Father   . AAA (abdominal aortic aneurysm) Brother     Social History Social History   Tobacco Use  . Smoking status: Current Every Day Smoker    Packs/day: 1.00    Years: 55.00    Pack years: 55.00    Types: Cigars  . Smokeless tobacco: Never Used  Vaping Use  . Vaping Use: Never used  Substance Use Topics  . Alcohol use: No  . Drug use: Never     Allergies   Morphine, Morphine and related, and Morpholine salicylate   Review of Systems Review of Systems  Constitutional: Negative.   Musculoskeletal:       Diffuse pain.   Physical Exam Triage Vital Signs ED Triage Vitals  Enc Vitals Group     BP 01/30/20 1406 105/74     Pulse Rate 01/30/20 1406 96     Resp 01/30/20 1406 18     Temp 01/30/20 1406  98 F (36.7 C)     Temp Source 01/30/20 1406 Oral     SpO2 01/30/20 1406 99 %     Weight 01/30/20 1408 210 lb (95.3 kg)     Height 01/30/20 1408 5\' 10"  (1.778 m)     Head Circumference --      Peak Flow --      Pain Score 01/30/20 1407 8     Pain Loc --      Pain Edu? --      Excl. in [place]? --    Updated Vital Signs BP 105/74 (BP Location: Right Arm)   Pulse 96   Temp 98 F (36.7 C) (Oral)   Resp 18   Ht 5\' 10"  (1.778 m)   Wt 95.3 kg   SpO2 99%   BMI 30.13 kg/m   Visual Acuity Right Eye Distance:   Left Eye Distance:   Bilateral Distance:    Right Eye Near:   Left Eye Near:    Bilateral Near:     Physical Exam Vitals and nursing note reviewed.  Constitutional:      Comments: Obese elderly male in no acute distress. Appears older than stated age.  HENT:     Head: Normocephalic and atraumatic.     Right Ear: Tympanic membrane normal.     Left Ear: Tympanic membrane normal.  Eyes:     General:        Right eye: No discharge.        Left eye: No discharge.     Conjunctiva/sclera: Conjunctivae normal.  Cardiovascular:     Rate and Rhythm: Normal rate and  regular rhythm.  Pulmonary:     Effort: Pulmonary effort is normal.     Breath sounds: Normal breath sounds. No wheezing, rhonchi or rales.  Musculoskeletal:     Comments: Right-sided lower rib tenderness.  No tenderness over the lateral hips bilaterally.   Neurological:     Mental Status: He is alert.  Psychiatric:        Mood and Affect: Mood normal.        Behavior: Behavior normal.    UC Treatments / Results  Labs (all labs ordered are listed, but only abnormal results are displayed) Labs Reviewed - No data to display  EKG   Radiology DG Ribs Bilateral W/Chest  Result Date: 01/30/2020 CLINICAL DATA:  [place] 01/28/2020, bilateral rib pain EXAM: BILATERAL RIBS AND CHEST - 4+ VIEW COMPARISON:  08/19/2008 FINDINGS: Frontal and oblique views of the thoracic cage are obtained. Cardiac silhouette is unremarkable. No airspace disease, effusion, or pneumothorax. There are no acute displaced fractures. IMPRESSION: 1. No acute displaced fractures.  No acute intrathoracic process. Electronically Signed   By: Randa Ngo M.D.   On: 01/30/2020 15:17   CT Head Wo Contrast  Result Date: 01/30/2020 CLINICAL DATA:  69 year old male with minor head trauma. EXAM: CT HEAD WITHOUT CONTRAST TECHNIQUE: Contiguous axial images were obtained from the base of the skull through the vertex without intravenous contrast. COMPARISON:  Head CT dated 03/01/2004. FINDINGS: Brain: Mild age-related atrophy and chronic microvascular ischemic changes. Small focus of old infarct and encephalomalacia involving the right cerebellar hemisphere. There is no acute intracranial hemorrhage. No mass effect or midline shift. No extra-axial fluid collection. Vascular: No hyperdense vessel or unexpected calcification. Skull: Normal. Negative for fracture or focal lesion. Sinuses/Orbits: No acute finding. Other: None IMPRESSION: 1. No acute intracranial pathology. 2. Mild age-related atrophy and chronic microvascular ischemic  changes. Small old  right cerebellar infarct and encephalomalacia. Electronically Signed   By: Anner Crete M.D.   On: 01/30/2020 15:38   DG Hip Unilat W or Wo Pelvis 2-3 Views Left  Result Date: 01/30/2020 CLINICAL DATA:  Fall, hip pain EXAM: DG HIP (WITH OR WITHOUT PELVIS) 2-3V RIGHT; DG HIP (WITH OR WITHOUT PELVIS) 2-3V LEFT COMPARISON:  None. FINDINGS: Osteopenia. No evidence of displaced hip or pelvic fracture. Hip joint spaces are preserved. Vascular calcinosis. IMPRESSION: No evidence of displaced hip or pelvic fracture. Osteopenia. Please note that sensitivity for hip and pelvic fracture is significantly limited in the setting of osteopenia. Consider CT or MRI to most sensitively evaluate if indicated by clinical suspicion for fracture. Electronically Signed   By: Eddie Candle M.D.   On: 01/30/2020 15:17   DG Hip Unilat W or Wo Pelvis 2-3 Views Right  Result Date: 01/30/2020 CLINICAL DATA:  Fall, hip pain EXAM: DG HIP (WITH OR WITHOUT PELVIS) 2-3V RIGHT; DG HIP (WITH OR WITHOUT PELVIS) 2-3V LEFT COMPARISON:  None. FINDINGS: Osteopenia. No evidence of displaced hip or pelvic fracture. Hip joint spaces are preserved. Vascular calcinosis. IMPRESSION: No evidence of displaced hip or pelvic fracture. Osteopenia. Please note that sensitivity for hip and pelvic fracture is significantly limited in the setting of osteopenia. Consider CT or MRI to most sensitively evaluate if indicated by clinical suspicion for fracture. Electronically Signed   By: Eddie Candle M.D.   On: 01/30/2020 15:17    Procedures Procedures (including critical care time)  Medications Ordered in UC Medications - No data to display  Initial Impression / Assessment and Plan / UC Course  I have reviewed the triage vital signs and the nursing notes.  Pertinent labs & imaging results that were available during my care of the patient were reviewed by me and considered in my medical decision making (see chart for details).     69 year old male presents for evaluation after suffering a fall and head trauma.  X-rays of the ribs, hips and CT of the head done today. Images were independent reviewed by me. No acute abnormalities. Patient has home pain medication.  Advised to take this as directed.  Supportive care.  Final Clinical Impressions(s) / UC Diagnoses   Final diagnoses:  Fall  Injury of head, initial encounter     Discharge Instructions     X-rays and CT were negative.  Continue your home pain medication.  Take care  Dr. Lacinda Axon   ED Prescriptions    None     PDMP not reviewed this encounter.      Coral Spikes, [place] 01/30/20 1801

## 2020-01-30 NOTE — ED Triage Notes (Addendum)
Patient in today c/o bilateral rib pain after falling on 01/28/20. Patient states he bumped his head on the concrete curb.  Patient fell at [employer] and landed on one of the parking curbs. Patient has not taken any OTC medications, but patient is prescribed Percocet and he took that on the day he fell.

## 2020-01-30 NOTE — Discharge Instructions (Addendum)
X-rays and CT were negative.  Continue your home pain medication.  Take care  Dr. Lacinda Axon

## 2020-02-09 DIAGNOSIS — R296 Repeated falls: Secondary | ICD-10-CM | POA: Insufficient documentation

## 2020-02-09 DIAGNOSIS — F0390 Unspecified dementia without behavioral disturbance: Secondary | ICD-10-CM | POA: Insufficient documentation

## 2020-02-09 DIAGNOSIS — G8929 Other chronic pain: Secondary | ICD-10-CM | POA: Insufficient documentation

## 2020-04-23 ENCOUNTER — Encounter (INDEPENDENT_AMBULATORY_CARE_PROVIDER_SITE_OTHER): Payer: Self-pay | Admitting: Vascular Surgery

## 2020-04-23 ENCOUNTER — Other Ambulatory Visit: Payer: Self-pay

## 2020-04-23 ENCOUNTER — Ambulatory Visit (INDEPENDENT_AMBULATORY_CARE_PROVIDER_SITE_OTHER): Payer: Medicare HMO

## 2020-04-23 ENCOUNTER — Ambulatory Visit (INDEPENDENT_AMBULATORY_CARE_PROVIDER_SITE_OTHER): Payer: Medicare HMO | Admitting: Vascular Surgery

## 2020-04-23 VITALS — BP 116/70 | HR 91 | Resp 16 | Wt 202.0 lb

## 2020-04-23 DIAGNOSIS — I70213 Atherosclerosis of native arteries of extremities with intermittent claudication, bilateral legs: Secondary | ICD-10-CM

## 2020-04-23 DIAGNOSIS — J449 Chronic obstructive pulmonary disease, unspecified: Secondary | ICD-10-CM | POA: Diagnosis not present

## 2020-04-23 DIAGNOSIS — I10 Essential (primary) hypertension: Secondary | ICD-10-CM | POA: Diagnosis not present

## 2020-04-23 DIAGNOSIS — E1159 Type 2 diabetes mellitus with other circulatory complications: Secondary | ICD-10-CM | POA: Diagnosis not present

## 2020-04-23 DIAGNOSIS — I7025 Atherosclerosis of native arteries of other extremities with ulceration: Secondary | ICD-10-CM

## 2020-04-23 DIAGNOSIS — E782 Mixed hyperlipidemia: Secondary | ICD-10-CM

## 2020-04-23 NOTE — Progress Notes (Signed)
MRN : [id number]  Sergio Vargas is a 70 y.o. (01-13-1951) male who presents with chief complaint of  Chief Complaint  Patient presents with  . Follow-up    Ultrasound follow up  .  History of Present Illness:   The patient returns to the office for followup and review of the noninvasive studies. There have been no interval changes in lower extremity symptoms. No interval shortening of the patient's claudication distance or development of rest pain symptoms. No new ulcers or wounds have occurred since the last visit.  There have been no significant changes to the patient's overall health care.  The patient denies amaurosis fugax or recent TIA symptoms. There are no recent neurological changes noted. The patient denies history of DVT, PE or superficial thrombophlebitis. The patient denies recent episodes of angina or shortness of breath.   ABI Rt=0.98 and Lt=0.89  (previous ABI's Rt=1.02 and Lt=0.89) Duplex ultrasound of the bilateral lower extremities uniform velocities throughout no focal hemodynamically significant stenosis identified  Current Meds  Medication Sig  . albuterol (VENTOLIN HFA) 108 (90 Base) MCG/ACT inhaler Inhale 2 puffs into the lungs every 6 (six) hours as needed for wheezing or shortness of breath.  . Artificial Tear Ointment (ARTIFICIAL TEARS) ointment Place 1 application into the left eye 3 (three) times daily.  Marland Kitchen aspirin EC 81 MG tablet Take 81 mg by mouth daily.  Marland Kitchen atorvastatin (LIPITOR) 80 MG tablet Take 80 mg by mouth 2 (two) times daily. At 6pm  . docusate sodium (COLACE) 100 MG capsule Take 1 capsule by mouth 2 (two) times daily. As needed  . fluticasone (FLONASE) 50 MCG/ACT nasal spray Place 2 sprays into both nostrils daily.  Marland Kitchen glipiZIDE (GLUCOTROL XL) 5 MG 24 hr tablet   . insulin NPH Human (HUMULIN N,NOVOLIN N) 100 UNIT/ML injection 48 Units.  . meclizine (ANTIVERT) 25 MG tablet Take 25 mg by mouth 3 (three) times daily.  . metFORMIN (GLUCOPHAGE)  1000 MG tablet Take 1,000 mg by mouth 2 (two) times daily.  . nitroGLYCERIN (NITROSTAT) 0.4 MG SL tablet Place 0.4 mg under the tongue as needed for chest pain.  Marland Kitchen oxyCODONE-acetaminophen (PERCOCET) 10-325 MG tablet Take 1 tablet by mouth every 6 (six) hours.  . triamcinolone ointment (KENALOG) 0.5 % Apply 1 application topically 2 (two) times daily.  Marland Kitchen venlafaxine XR (EFFEXOR-XR) 75 MG 24 hr capsule Take 150 mg by mouth daily.    Past Medical History:  Diagnosis Date  . Allergy   . Arthritis   . Cancer (HCC)    THYROID  . COPD (chronic obstructive pulmonary disease) ([place])   . Coronary artery disease   . Depression   . Diabetes mellitus without complication ([place])   . GERD (gastroesophageal reflux disease)   . HOH (hard of hearing)   . Myocardial infarction ([place])   . Orthopnea   . Stroke ([place])   . Vertigo   . Wheezing     Past Surgical History:  Procedure Laterality Date  . ADRENAL GLAND SURGERY    . CATARACT EXTRACTION W/PHACO Left 04/07/2015   Procedure: CATARACT EXTRACTION PHACO AND INTRAOCULAR LENS PLACEMENT (IOC);  Surgeon: Birder Robson, MD;  Location: [facility] ORS;  Service: Ophthalmology;  Laterality: Left;  [place] 00:41 AP% 22.9 CDE 9.52 fluid pack lot #[id number]  . CORONARY ANGIOPLASTY    . EYE SURGERY    . HAND SURGERY      Social History Social History   Tobacco Use  . Smoking status: Current Every  Day Smoker    Packs/day: 1.00    Years: 55.00    Pack years: 55.00    Types: Cigars  . Smokeless tobacco: Never Used  Vaping Use  . Vaping Use: Never used  Substance Use Topics  . Alcohol use: No  . Drug use: Never    Family History Family History  Problem Relation Age of Onset  . Heart disease Mother   . Diabetes Father   . AAA (abdominal aortic aneurysm) Brother     Allergies  Allergen Reactions  . Morphine And Related   . Morphine Nausea Only    Other reaction(s): Other (See Comments) Other reaction(s): Other (See Comments)  . Morpholine  Salicylate Nausea And Vomiting     REVIEW OF SYSTEMS (Negative unless checked)  Constitutional: [] Weight loss  [] Fever  [] Chills Cardiac: [] Chest pain   [] Chest pressure   [] Palpitations   [] Shortness of breath when laying flat   [] Shortness of breath with exertion. Vascular:  [x] Pain in legs with walking   [] Pain in legs at rest  [] History of DVT   [] Phlebitis   [] Swelling in legs   [] Varicose veins   [] Non-healing ulcers Pulmonary:   [] Uses home oxygen   [] Productive cough   [] Hemoptysis   [] Wheeze  [] COPD   [] Asthma Neurologic:  [] Dizziness   [] Seizures   [] History of stroke   [] History of TIA  [] Aphasia   [] Vissual changes   [] Weakness or numbness in arm   [] Weakness or numbness in leg Musculoskeletal:   [] Joint swelling   [x] Joint pain   [] Low back pain Hematologic:  [] Easy bruising  [] Easy bleeding   [] Hypercoagulable state   [] Anemic Gastrointestinal:  [] Diarrhea   [] Vomiting  [] Gastroesophageal reflux/heartburn   [] Difficulty swallowing. Genitourinary:  [] Chronic kidney disease   [] Difficult urination  [] Frequent urination   [] Blood in urine Skin:  [] Rashes   [] Ulcers  Psychological:  [] History of anxiety   []  History of major depression.  Physical Examination  Vitals:   04/23/20 1446  BP: 116/70  Pulse: 91  Resp: 16  Weight: 202 lb (91.6 kg)   Body mass index is 28.98 kg/m. Gen: WD/WN, NAD Head: [place]/AT, No temporalis wasting.  Ear/Nose/Throat: Hearing grossly intact, nares w/o erythema or drainage Eyes: PER, EOMI, sclera nonicteric.  Neck: Supple, no large masses.   Pulmonary:  Good air movement, no audible wheezing bilaterally, no use of accessory muscles.  Cardiac: RRR, no JVD Vascular:  Vessel Right Left  Radial Palpable Palpable  PT Not Palpable Not Palpable  DP Not Palpable Not Palpable  Gastrointestinal: Non-distended. No guarding/no peritoneal signs.  Musculoskeletal: M/S 5/5 throughout.  No deformity or atrophy.  Neurologic: CN 2-12 intact. Symmetrical.   Speech is fluent. Motor exam as listed above. Psychiatric: Judgment intact, Mood & affect appropriate for pt's clinical situation. Dermatologic: No rashes or ulcers noted.  No changes consistent with cellulitis.   CBC No results found for: WBC, HGB, HCT, MCV, PLT  BMET No results found for: NA, K, CL, CO2, GLUCOSE, BUN, CREATININE, CALCIUM, GFRNONAA, GFRAA CrCl cannot be calculated (No successful lab value found.).  COAG No results found for: INR, PROTIME  Radiology No results found.   Assessment/Plan 1. Atherosclerosis of native artery of both lower extremities with intermittent claudication (HCC)  Recommend:  The patient has evidence of atherosclerosis of the lower extremities with claudication.  The patient does not voice lifestyle limiting changes at this point in time.  Noninvasive studies do not suggest clinically significant change.  No invasive  studies, angiography or surgery at this time The patient should continue walking and begin a more formal exercise program.  The patient should continue antiplatelet therapy and aggressive treatment of the lipid abnormalities  No changes in the patient's medications at this time  - VAS [place] ABI WITH/WO TBI; Future  2. Essential hypertension Continue antihypertensive medications as already ordered, these medications have been reviewed and there are no changes at this time.   3. Chronic obstructive pulmonary disease, unspecified COPD type ([place]) Continue pulmonary medications and aerosols as already ordered, these medications have been reviewed and there are no changes at this time.    4. Type 2 diabetes mellitus with other circulatory complication, unspecified whether long term insulin use (HCC) Continue hypoglycemic medications as already ordered, these medications have been reviewed and there are no changes at this time.  Hgb A1C to be monitored as already arranged by primary service   5. Mixed hyperlipidemia Continue  statin as ordered and reviewed, no changes at this time     Hortencia Pilar, MD  04/23/2020 8:52 PM

## 2020-05-08 ENCOUNTER — Other Ambulatory Visit: Payer: Self-pay | Admitting: Neurology

## 2020-05-08 DIAGNOSIS — Z8673 Personal history of transient ischemic attack (TIA), and cerebral infarction without residual deficits: Secondary | ICD-10-CM

## 2020-05-18 ENCOUNTER — Ambulatory Visit
Admission: RE | Admit: 2020-05-18 | Discharge: 2020-05-18 | Disposition: A | Discharge status: Home / Self Care | Payer: Medicare HMO | Source: Ambulatory Visit | Attending: Neurology | Admitting: Neurology

## 2020-05-18 ENCOUNTER — Other Ambulatory Visit: Payer: Self-pay

## 2020-05-18 DIAGNOSIS — Z8673 Personal history of transient ischemic attack (TIA), and cerebral infarction without residual deficits: Secondary | ICD-10-CM | POA: Insufficient documentation

## 2020-06-09 ENCOUNTER — Ambulatory Visit: Payer: Medicare HMO | Attending: Otolaryngology

## 2020-06-09 DIAGNOSIS — R413 Other amnesia: Secondary | ICD-10-CM | POA: Insufficient documentation

## 2020-06-09 DIAGNOSIS — G4730 Sleep apnea, unspecified: Secondary | ICD-10-CM | POA: Insufficient documentation

## 2020-06-10 ENCOUNTER — Other Ambulatory Visit: Payer: Self-pay

## 2020-08-18 ENCOUNTER — Other Ambulatory Visit: Payer: Self-pay | Admitting: Physician Assistant

## 2020-08-18 DIAGNOSIS — M542 Cervicalgia: Secondary | ICD-10-CM

## 2020-08-18 DIAGNOSIS — R202 Paresthesia of skin: Secondary | ICD-10-CM

## 2020-08-18 DIAGNOSIS — R200 Anesthesia of skin: Secondary | ICD-10-CM

## 2020-08-25 ENCOUNTER — Ambulatory Visit
Admission: RE | Admit: 2020-08-25 | Discharge: 2020-08-25 | Disposition: A | Discharge status: Home / Self Care | Payer: Medicare HMO | Source: Ambulatory Visit | Attending: Physician Assistant | Admitting: Physician Assistant

## 2020-08-25 ENCOUNTER — Other Ambulatory Visit: Payer: Self-pay

## 2020-08-25 DIAGNOSIS — R200 Anesthesia of skin: Secondary | ICD-10-CM

## 2020-08-25 DIAGNOSIS — M542 Cervicalgia: Secondary | ICD-10-CM | POA: Insufficient documentation

## 2020-08-25 DIAGNOSIS — R202 Paresthesia of skin: Secondary | ICD-10-CM | POA: Insufficient documentation

## 2020-10-15 ENCOUNTER — Encounter (INDEPENDENT_AMBULATORY_CARE_PROVIDER_SITE_OTHER): Payer: Medicare HMO

## 2020-10-15 ENCOUNTER — Ambulatory Visit (INDEPENDENT_AMBULATORY_CARE_PROVIDER_SITE_OTHER): Payer: Medicare HMO | Admitting: Vascular Surgery

## 2020-12-05 DIAGNOSIS — Z85850 Personal history of malignant neoplasm of thyroid: Secondary | ICD-10-CM | POA: Diagnosis not present

## 2020-12-05 DIAGNOSIS — S0101XA Laceration without foreign body of scalp, initial encounter: Secondary | ICD-10-CM | POA: Insufficient documentation

## 2020-12-05 DIAGNOSIS — I2510 Atherosclerotic heart disease of native coronary artery without angina pectoris: Secondary | ICD-10-CM | POA: Insufficient documentation

## 2020-12-05 DIAGNOSIS — Z794 Long term (current) use of insulin: Secondary | ICD-10-CM | POA: Insufficient documentation

## 2020-12-05 DIAGNOSIS — F17210 Nicotine dependence, cigarettes, uncomplicated: Secondary | ICD-10-CM | POA: Insufficient documentation

## 2020-12-05 DIAGNOSIS — J449 Chronic obstructive pulmonary disease, unspecified: Secondary | ICD-10-CM | POA: Insufficient documentation

## 2020-12-05 DIAGNOSIS — E119 Type 2 diabetes mellitus without complications: Secondary | ICD-10-CM | POA: Diagnosis not present

## 2020-12-05 DIAGNOSIS — Z7984 Long term (current) use of oral hypoglycemic drugs: Secondary | ICD-10-CM | POA: Diagnosis not present

## 2020-12-05 DIAGNOSIS — F0390 Unspecified dementia without behavioral disturbance: Secondary | ICD-10-CM | POA: Diagnosis not present

## 2020-12-05 DIAGNOSIS — Z7982 Long term (current) use of aspirin: Secondary | ICD-10-CM | POA: Insufficient documentation

## 2020-12-05 DIAGNOSIS — W01198A Fall on same level from slipping, tripping and stumbling with subsequent striking against other object, initial encounter: Secondary | ICD-10-CM | POA: Insufficient documentation

## 2020-12-06 ENCOUNTER — Emergency Department: Payer: Medicare HMO

## 2020-12-06 ENCOUNTER — Emergency Department
Admission: EM | Admit: 2020-12-06 | Discharge: 2020-12-06 | Disposition: A | Discharge status: Home / Self Care | Payer: Medicare HMO | Attending: Student in an Organized Health Care Education/Training Program | Admitting: Student in an Organized Health Care Education/Training Program

## 2020-12-06 ENCOUNTER — Other Ambulatory Visit: Payer: Self-pay

## 2020-12-06 DIAGNOSIS — S0101XA Laceration without foreign body of scalp, initial encounter: Secondary | ICD-10-CM

## 2020-12-06 DIAGNOSIS — S0990XA Unspecified injury of head, initial encounter: Secondary | ICD-10-CM

## 2020-12-06 LAB — COMPREHENSIVE METABOLIC PANEL
ALT: 19 U/L (ref 0–44)
AST: 15 U/L (ref 15–41)
Albumin: 4 g/dL (ref 3.5–5.0)
Alkaline Phosphatase: 77 U/L (ref 38–126)
Anion gap: 7 (ref 5–15)
BUN: 35 mg/dL — ABNORMAL HIGH (ref 8–23)
CO2: 26 mmol/L (ref 22–32)
Calcium: 8.8 mg/dL — ABNORMAL LOW (ref 8.9–10.3)
Chloride: 106 mmol/L (ref 98–111)
Creatinine, Ser: 2.1 mg/dL — ABNORMAL HIGH (ref 0.61–1.24)
GFR, Estimated: 34 mL/min — ABNORMAL LOW (ref >60)
Glucose, Bld: 182 mg/dL — ABNORMAL HIGH (ref 70–99)
Potassium: 4.3 mmol/L (ref 3.5–5.1)
Sodium: 139 mmol/L (ref 135–145)
Total Bilirubin: 0.5 mg/dL (ref 0.3–1.2)
Total Protein: 7.1 g/dL (ref 6.5–8.1)

## 2020-12-06 LAB — CBC
HCT: 37.2 % — ABNORMAL LOW (ref 39.0–52.0)
Hemoglobin: 12.6 g/dL — ABNORMAL LOW (ref 13.0–17.0)
MCH: 31.5 pg (ref 26.0–34.0)
MCHC: 33.9 g/dL (ref 30.0–36.0)
MCV: 93 fL (ref 80.0–100.0)
Platelets: 123 10*3/uL — ABNORMAL LOW (ref 150–400)
RBC: 4 MIL/uL — ABNORMAL LOW (ref 4.22–5.81)
RDW: 12.6 % (ref 11.5–15.5)
WBC: 11.1 10*3/uL — ABNORMAL HIGH (ref 4.0–10.5)
nRBC: 0 % (ref 0.0–0.2)

## 2020-12-06 LAB — TROPONIN I (HIGH SENSITIVITY): Troponin I (High Sensitivity): 14 ng/L (ref <18)

## 2020-12-06 MED ORDER — LIDOCAINE HCL (PF) 1 % IJ SOLN: 5.0000 mL | Freq: Once | INTRAMUSCULAR | Status: AC

## 2020-12-06 MED ORDER — LIDOCAINE HCL (PF) 1 % IJ SOLN
INTRAMUSCULAR | Status: AC
  Administered 2020-12-06: 5 mL via INTRADERMAL
  Filled 2020-12-06: qty 5

## 2020-12-06 NOTE — Discharge Instructions (Addendum)
Please follow up with PCP for repeat blood work.  Your kidney function is a little bit worse than your previous labs.  Drink plenty of fluids.  Follow up in clinic in 1 week for removal of the staple.

## 2020-12-06 NOTE — ED Triage Notes (Signed)
Pt presents to ER after he was walking and slipped and fell, and hit the right posterior part of his head on a sign pole.  Pt has appx 4 cm well approximated laceration to head. Bleeding is controlled at this time.  Pt also has hx of stable aneurism and wants to make sure he has not ruptured it.  Pt A&O x4 and denies HA at this time.

## 2020-12-06 NOTE — ED Provider Notes (Signed)
[facility] Emergency Department Provider Note    Event Date/Time   First MD Initiated Contact with Patient 12/06/20 0900     (approximate)  I have reviewed the triage vital signs and the nursing notes.   HISTORY  Chief Complaint Fall and Head Laceration    HPI Sergio Vargas is a 70 y.o. male with below listed past medical history presents to the ER for evaluation of head injury laceration right scalp that occurred last night whether leaving a restaurant.  Patient started feeling lightheaded as he was standing which she is prone to do.  Lost his balance and fell back hitting pole of a sign post.  No LOC.  No numbness or tingling.  They then went to a haunted house he started feeling lightheaded again and then noted some bleeding from the back of his head.  He is on aspirin.  Denies any chest pain or pressure.  No nausea or vomiting.  No blurry vision.  Past Medical History:  Diagnosis Date   Allergy    Arthritis    Cancer ([place])    THYROID   COPD (chronic obstructive pulmonary disease) (HCC)    Coronary artery disease    Depression    Diabetes mellitus without complication (HCC)    GERD (gastroesophageal reflux disease)    HOH (hard of hearing)    Myocardial infarction ([place])    Orthopnea    Stroke ([place])    Vertigo    Wheezing    Family History  Problem Relation Age of Onset   Heart disease Mother    Diabetes Father    AAA (abdominal aortic aneurysm) Brother    Past Surgical History:  Procedure Laterality Date   ADRENAL GLAND SURGERY     CATARACT EXTRACTION W/PHACO Left 04/07/2015   Procedure: CATARACT EXTRACTION PHACO AND INTRAOCULAR LENS PLACEMENT ([place]);  Surgeon: Birder Robson, MD;  Location: [facility] ORS;  Service: Ophthalmology;  Laterality: Left;  [place] 00:41 AP% 22.9 CDE 9.52 fluid pack lot #[id number]   CORONARY ANGIOPLASTY     EYE SURGERY     HAND SURGERY     Patient Active Problem List   Diagnosis Date Noted   Chronic pain  02/09/2020   Dementia ([place]) 02/09/2020   Recurrent falls 02/09/2020   Aneurysm of posterior communicating artery 12/04/2019   Dizziness 10/21/2019   Multiple thyroid nodules 05/24/2018   Constipation 01/09/2018   TIA (transient ischemic attack) 12/29/2017   Central retinal vein occlusion, right eye, stable 12/28/2017   Restrictive lung disease 09/05/2017   Age-related nuclear cataract of right eye 08/18/2017   Macular ischemia of both eyes 08/18/2017   Diabetes ([place]) 08/11/2017   Complete tear of right rotator cuff 09/20/2016   Vitreous hemorrhage of right eye ([place]) 08/26/2016   AK (actinic keratosis) 02/09/2016   Abnormal gait 06/03/2015   Incontinence 05/14/2015   COPD (chronic obstructive pulmonary disease) ([place]) 03/20/2015   Asthma 09/26/2014   Depression 07/23/2013   Malignant neoplasm of thyroid gland ([place]) 10/29/2012   Osteoarthritis 09/27/2012   History of myocardial infarction 05/16/2012   Lung nodule, multiple 05/16/2012   Aortic root dilation ([place]) 05/16/2012   Other nonspecific abnormal finding of lung field 05/16/2012   Obstructive sleep apnea 01/25/2012   Atherosclerosis of native arteries of extremity with intermittent claudication ([place]) 12/23/2011   Pain medication agreement 09/08/2011   Essential hypertension 03/15/2011   Atherosclerotic heart disease of native coronary artery without angina pectoris 03/15/2011   Eczema 11/29/2010  Severe nonproliferative diabetic retinopathy ([place]) 10/12/2010   Current tobacco use 08/18/2010   Mixed hyperlipidemia 08/18/2010      Prior to Admission medications   Medication Sig Start Date End Date Taking? Authorizing Provider  albuterol (VENTOLIN HFA) 108 (90 Base) MCG/ACT inhaler Inhale 2 puffs into the lungs every 6 (six) hours as needed for wheezing or shortness of breath.    [provider]  Artificial Tear Ointment (ARTIFICIAL TEARS) ointment Place 1 application into the left eye 3 (three) times daily. 09/03/14    [provider]  aspirin EC 81 MG tablet Take 81 mg by mouth daily. 08/18/10   [provider]  atorvastatin (LIPITOR) 80 MG tablet Take 80 mg by mouth 2 (two) times daily. At 6pm 04/02/15   [provider]  clobetasol ointment (TEMOVATE) 6.26 % Apply 1 application topically as needed. Patient not taking: Reported on 04/23/2020 05/22/14   [provider]  docusate sodium (COLACE) 100 MG capsule Take 1 capsule by mouth 2 (two) times daily. As needed 09/27/12   [provider]  fluticasone (FLONASE) 50 MCG/ACT nasal spray Place 2 sprays into both nostrils daily. 06/15/15   [provider]  Fluticasone-Salmeterol (ADVAIR) 250-50 MCG/DOSE AEPB Inhale 1 puff into the lungs 2 (two) times daily. 06/15/15 01/30/20  [provider]  gabapentin (NEURONTIN) 100 MG capsule Take 100 mg by mouth 2 (two) times daily. Patient not taking: No sig reported 09/15/15   [provider]  glipiZIDE (GLUCOTROL XL) 5 MG 24 hr tablet  01/09/18   [provider]  insulin glargine (LANTUS) 100 UNIT/ML injection Inject 60 Units into the skin 2 (two) times daily. Patient not taking: No sig reported    [provider]  insulin NPH Human (HUMULIN N,NOVOLIN N) 100 UNIT/ML injection 48 Units. 07/19/17   [provider]  meclizine (ANTIVERT) 25 MG tablet Take 25 mg by mouth 3 (three) times daily.    [provider]  metFORMIN (GLUCOPHAGE) 1000 MG tablet Take 1,000 mg by mouth 2 (two) times daily. 04/02/15   [provider]  nitroGLYCERIN (NITROSTAT) 0.4 MG SL tablet Place 0.4 mg under the tongue as needed for chest pain. 08/18/10   [provider]  oxyCODONE-acetaminophen (PERCOCET) 10-325 MG tablet Take 1 tablet by mouth every 6 (six) hours. 08/10/15   [provider]  pantoprazole (PROTONIX) 20 MG tablet Take 20 mg by mouth daily. 06/15/15 03/09/18  [provider]  triamcinolone ointment (KENALOG) 0.5 %  Apply 1 application topically 2 (two) times daily. 03/09/18   Coral Spikes, DO  venlafaxine XR (EFFEXOR-XR) 75 MG 24 hr capsule Take 150 mg by mouth daily. 04/02/15   [provider]    Allergies Morphine and related, Morphine, and Morpholine salicylate    Social History Social History   Tobacco Use   Smoking status: Every Day    Packs/day: 1.00    Years: 55.00    Pack years: 55.00    Types: Cigars, Cigarettes   Smokeless tobacco: Never  Vaping Use   Vaping Use: Never used  Substance Use Topics   Alcohol use: No   Drug use: Never    Review of Systems Patient denies headaches, rhinorrhea, blurry vision, numbness, shortness of breath, chest pain, edema, cough, abdominal pain, nausea, vomiting, diarrhea, dysuria, fevers, rashes or hallucinations unless otherwise stated above in HPI. ____________________________________________   PHYSICAL EXAM:  VITAL SIGNS: Vitals:   12/06/20 0011 12/06/20 0619  BP: 115/76 131/77  Pulse: 83  83  Resp: 16 17  Temp: 97.8 F (36.6 C) 97.7 F (36.5 C)  SpO2: 100% 100%    Constitutional: Alert and oriented.  Eyes: Conjunctivae are normal.  Head: 4 cm laceration of the right parietal scalp. Nose: No congestion/rhinnorhea. Mouth/Throat: Mucous membranes are moist.   Neck: No stridor. Painless ROM.  Cardiovascular: Normal rate, regular rhythm. Grossly normal heart sounds.  Good peripheral circulation. Respiratory: Normal respiratory effort.  No retractions. Lungs CTAB. Gastrointestinal: Soft and nontender. No distention. No abdominal bruits. No CVA tenderness. Genitourinary:  Musculoskeletal: No lower extremity tenderness nor edema.  No joint effusions. Neurologic:  Normal speech and language. No gross focal neurologic deficits are appreciated. No facial droop Skin:  Skin is warm, dry and intact. No rash noted. Psychiatric: Mood and affect are normal. Speech and behavior are  normal.  ____________________________________________   LABS (all labs ordered are listed, but only abnormal results are displayed)  Results for orders placed or performed during the hospital encounter of 12/06/20 (from the past 24 hour(s))  CBC     Status: Abnormal   Collection Time: 12/06/20  9:10 AM  Result Value Ref Range   WBC 11.1 (H) 4.0 - 10.5 K/uL   RBC 4.00 (L) 4.22 - 5.81 MIL/uL   Hemoglobin 12.6 (L) 13.0 - 17.0 g/dL   HCT 37.2 (L) 39.0 - 52.0 %   MCV 93.0 80.0 - 100.0 fL   MCH 31.5 26.0 - 34.0 pg   MCHC 33.9 30.0 - 36.0 g/dL   RDW 12.6 11.5 - 15.5 %   Platelets 123 (L) 150 - 400 K/uL   nRBC 0.0 0.0 - 0.2 %  Comprehensive metabolic panel     Status: Abnormal   Collection Time: 12/06/20  9:10 AM  Result Value Ref Range   Sodium 139 135 - 145 mmol/L   Potassium 4.3 3.5 - 5.1 mmol/L   Chloride 106 98 - 111 mmol/L   CO2 26 22 - 32 mmol/L   Glucose, Bld 182 (H) 70 - 99 mg/dL   BUN 35 (H) 8 - 23 mg/dL   Creatinine, Ser 2.10 (H) 0.61 - 1.24 mg/dL   Calcium 8.8 (L) 8.9 - 10.3 mg/dL   Total Protein 7.1 6.5 - 8.1 g/dL   Albumin 4.0 3.5 - 5.0 g/dL   AST 15 15 - 41 U/L   ALT 19 0 - 44 U/L   Alkaline Phosphatase 77 38 - 126 U/L   Total Bilirubin 0.5 0.3 - 1.2 mg/dL   GFR, Estimated 34 (L) >60 mL/min   Anion gap 7 5 - 15  Troponin I (High Sensitivity)     Status: None   Collection Time: 12/06/20  9:10 AM  Result Value Ref Range   Troponin I (High Sensitivity) 14 <18 ng/L   ____________________________________________  EKG My review and personal interpretation at Time: 9:38   Indication: fall  Rate: 80  Rhythm: sinus 1st degree av block Axis: normal Other: normal intervals, no stemi ____________________________________________  RADIOLOGY  I personally reviewed all radiographic images ordered to evaluate for the above acute complaints and reviewed radiology reports and findings.  These findings were personally discussed with the patient.  Please see medical record  for radiology report.  ____________________________________________   PROCEDURES  Procedure(s) performed:  Marland KitchenMarland KitchenLaceration Repair  Date/Time: 12/06/2020 10:43 AM Performed by: Merlyn Lot, MD Authorized by: Merlyn Lot, MD   Consent:    Consent obtained:  Verbal   Consent given by:  Patient Anesthesia:    Anesthesia  method:  Local infiltration   Local anesthetic:  Lidocaine 1% w/o epi Laceration details:    Location:  Scalp   Scalp location:  R parietal   Length (cm):  4   Depth (mm):  4 Treatment:    Area cleansed with:  Chlorhexidine and saline Skin repair:    Repair method:  Staples   Number of staples:  7 Approximation:    Approximation:  Close    Critical Care performed: no ____________________________________________   INITIAL IMPRESSION / ASSESSMENT AND PLAN / ED COURSE  Pertinent labs & imaging results that were available during my care of the patient were reviewed by me and considered in my medical decision making (see chart for details).   DDX: Laceration, contusion, concussion, SDH, IPH, dehydration, dysrhythmia  ZHI GEIER is a 70 y.o. who presents to the ED with presentation as described above.  Patient with laceration was repaired as above.  Patient very prone to dizzy spells and this has been an ongoing problem.  No new neurodeficits.  CT imaging is reassuring.  Does not seem consistent with IPH or aneurysm.  Not consistent with CVA.  Blood work was checked which is does show mild decrease in his renal function, but no hyperglycemic crisis.  His hemoglobin is stable.  Discussed option for IV fluids but patient tolerating p.o. family has been here in the ER for quite some time feeling well and would like to follow-up with her PCP which I think is reasonable.  Discussed return precautions and wound care.     The patient was evaluated in Emergency Department today for the symptoms described in the history of present illness. He/she was  evaluated in the context of the global COVID-19 pandemic, which necessitated consideration that the patient might be at risk for infection with the SARS-CoV-2 virus that causes COVID-19. Institutional protocols and algorithms that pertain to the evaluation of patients at risk for COVID-19 are in a state of rapid change based on information released by regulatory bodies including the CDC and federal and state organizations. These policies and algorithms were followed during the patient's care in the ED.  As part of my medical decision making, I reviewed the following data within the [place] notes reviewed and incorporated, Labs reviewed, notes from prior ED visits and [place] Controlled Substance Database   ____________________________________________   FINAL CLINICAL IMPRESSION(S) / ED DIAGNOSES  Final diagnoses:  Injury of head, initial encounter  Laceration of scalp without foreign body, initial encounter      NEW MEDICATIONS STARTED DURING THIS VISIT:  New Prescriptions   No medications on file     Note:  This document was prepared using Dragon voice recognition software and may include unintentional dictation errors.    Merlyn Lot, MD 12/06/20 1045

## 2022-09-22 ENCOUNTER — Other Ambulatory Visit: Payer: Self-pay

## 2022-11-02 IMAGING — CT CT CERVICAL SPINE W/O CM
3 of 4 series · 12 of 34 positions shown, 14 images · non-contrast
Comparison: None.

CLINICAL DATA: Head trauma, mod-severe fall, right head laceration,
H/O aneurism; Neck trauma (Age >= 65y). Fall, head injury.

EXAM:
CT HEAD WITHOUT CONTRAST
CT CERVICAL SPINE WITHOUT CONTRAST
TECHNIQUE: Multidetector CT imaging of the head and cervical spine was
performed following the standard protocol without intravenous
contrast. Multiplanar CT image reconstructions of the cervical spine
were also generated.

[Series 4: sagittal bone · sagittal · 0.33mm/px · 5 of 94 slices shown, 6 images]
[im 32/94  bone]
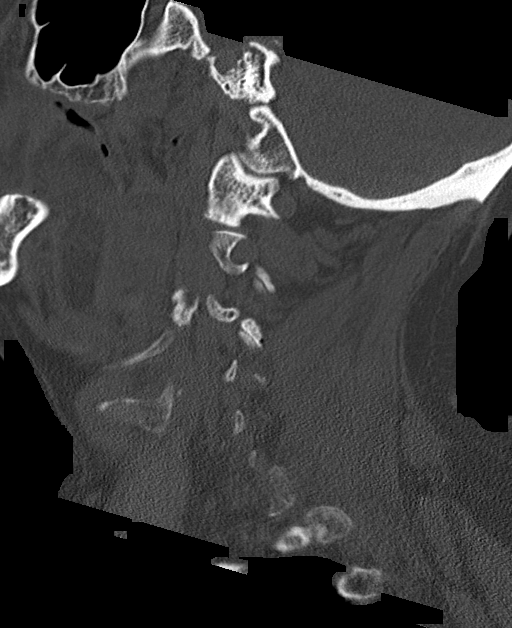
[im 39/94  bone]
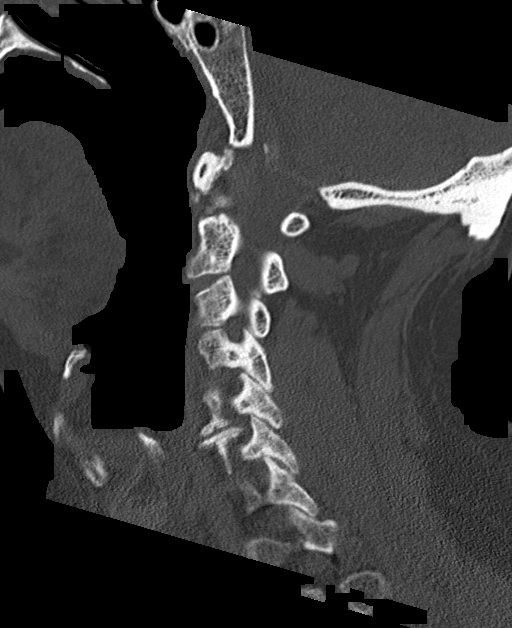
[im 47/94  soft-tissue]
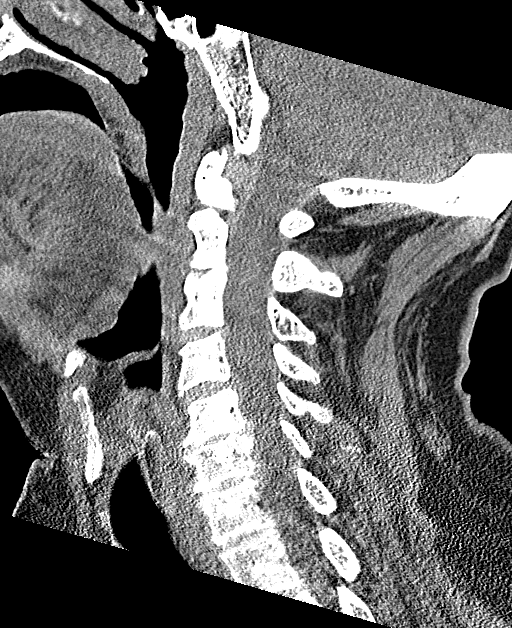
[im 47/94  bone]
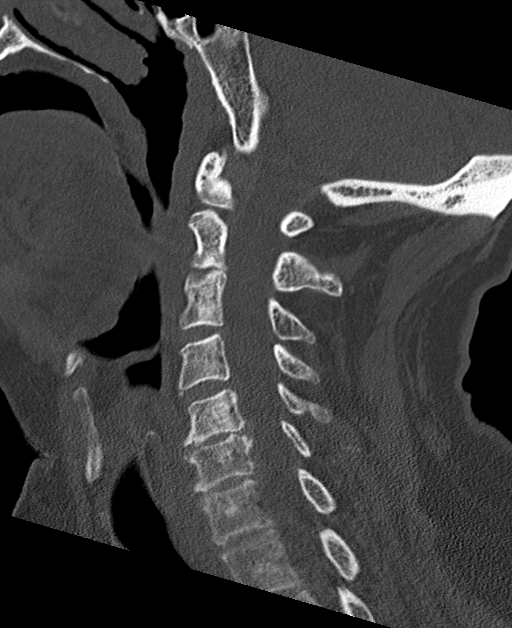
[im 55/94  bone]
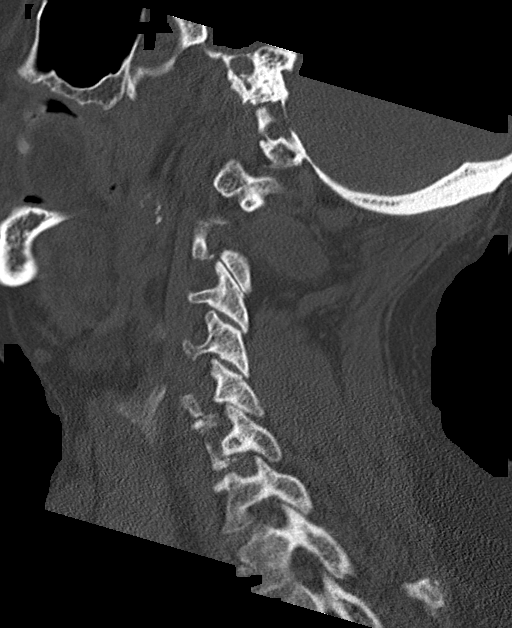
[im 63/94  bone]
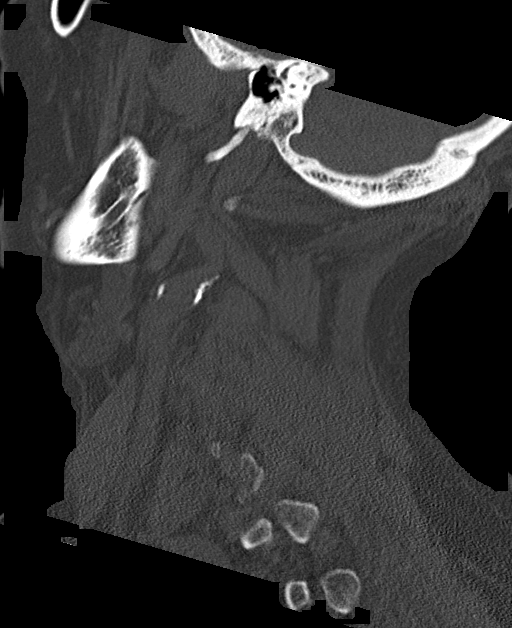

[Series 5: coronal bone · coronal · 0.33mm/px · 3 of 71 slices shown]
[im 15/71  bone]
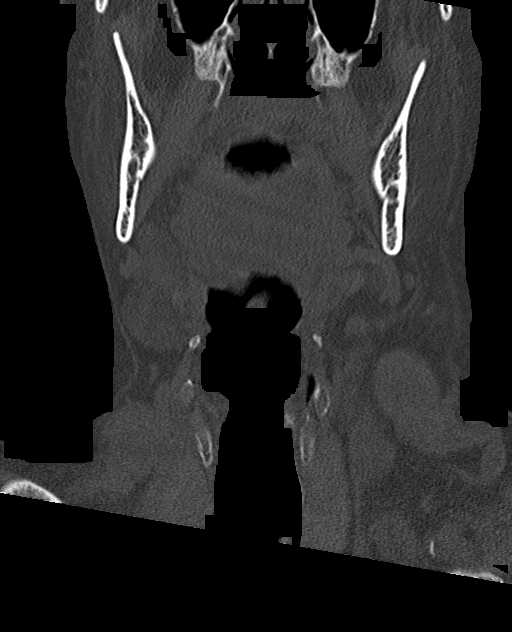
[im 29/71  bone]
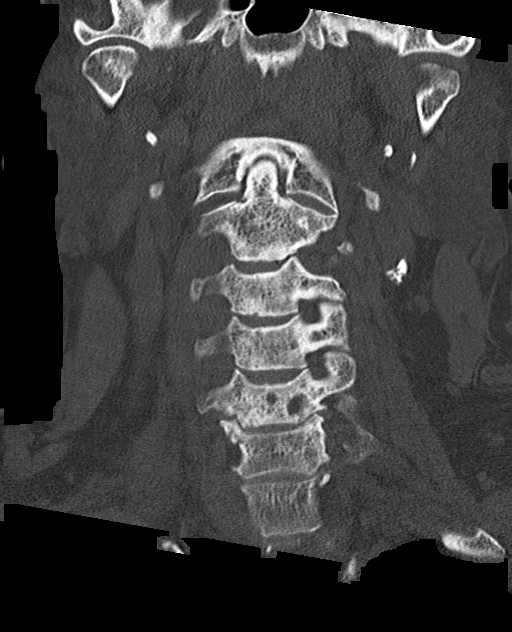
[im 43/71  bone]
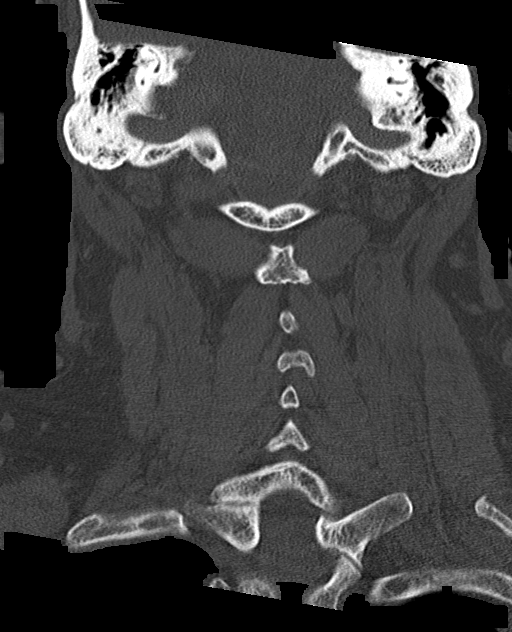

[Series 6: orthogonal bone · axial · 0.24mm/px · z∈[-306,-167]mm · 4 of 103 slices shown, 5 images]
[im 15/103  soft-tissue]
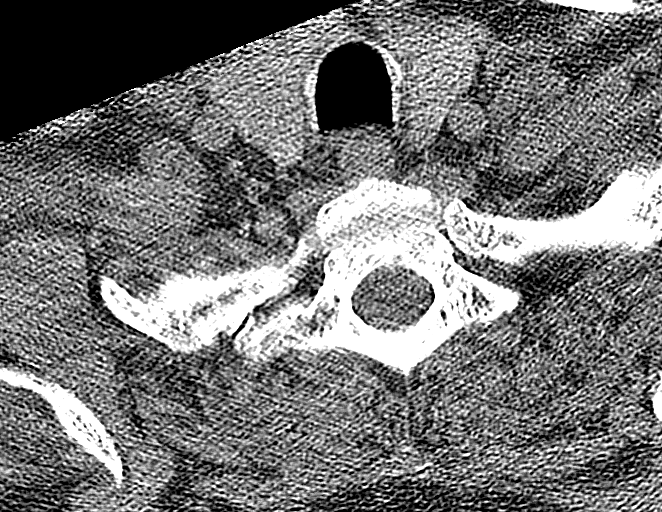
[im 15/103  bone]
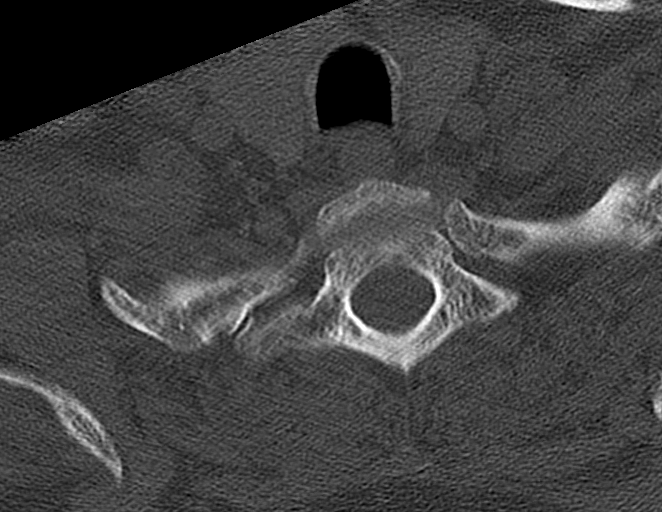
[im 44/103  bone]
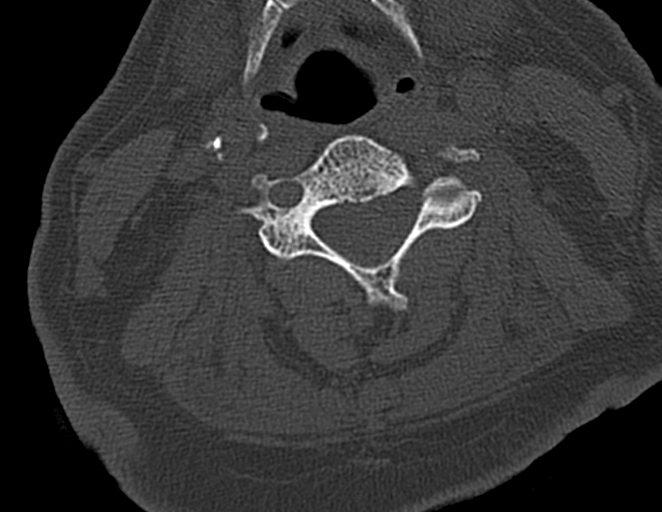
[im 59/103  bone]
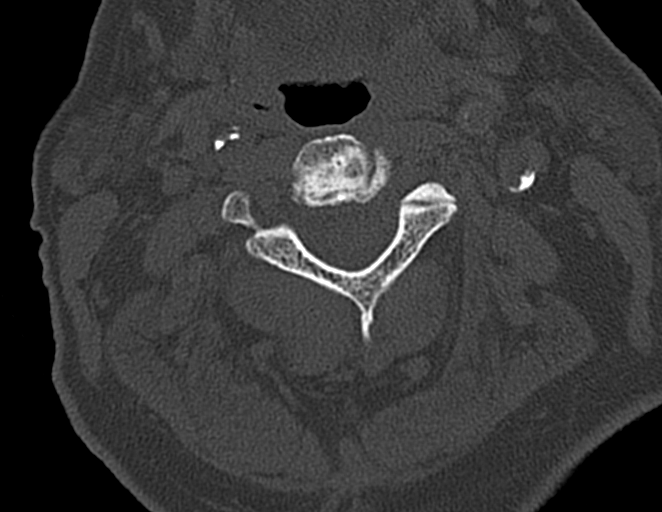
[im 88/103  bone]
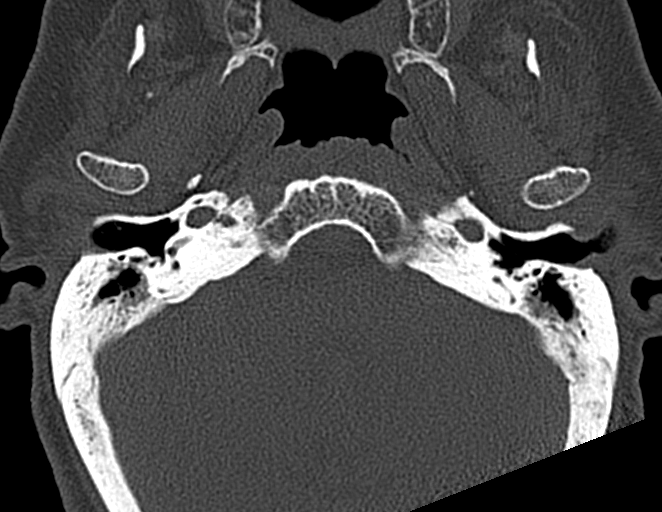

[12 of 34 positions shown; findings below may reference images not displayed]

FINDINGS: CT HEAD FINDINGS

Brain: Normal anatomic configuration. Parenchymal volume loss is
commensurate with the patient's age. Moderate periventricular white
matter changes are present likely reflecting the sequela of small
vessel ischemia. No abnormal intra or extra-axial mass lesion or
fluid collection. No abnormal mass effect or midline shift. No
evidence of acute intracranial hemorrhage or infarct. Ventricular
size is normal. Cerebellum unremarkable.

Vascular: No asymmetric hyperdense vasculature at the skull base.
Advanced vascular calcifications are noted within the carotid
siphons.

Skull: Intact

Sinuses/Orbits: Paranasal sinuses are clear. Orbits are
unremarkable.

Other: Mastoid air cells and middle ear cavities are clear. Mild
right parietal scalp soft tissue swelling.

CT CERVICAL SPINE FINDINGS

Alignment: Normal.

Skull base and vertebrae: Craniocervical alignment is normal. The
atlantodental interval is not widened. No acute fracture of the
cervical spine. Vertebral body height has been preserved.

Soft tissues and spinal canal: No prevertebral fluid or swelling. No
visible canal hematoma. Mild central canal stenosis at C5-6
secondary to posterior disc osteophyte complex with abutment but no
remodeling of the thecal sac.

Disc levels: Intervertebral disc space narrowing and endplate
remodeling at C5-C7 is in keeping with changes of moderate
degenerative disc disease. The prevertebral soft tissues are not
thickened. Review of the axial images demonstrates moderate
bilateral neuroforaminal narrowing at C5-6 secondary to
uncovertebral arthrosis.

Upper chest: Unremarkable

Other: None
IMPRESSION: No acute intracranial abnormality. No calvarial fracture. Mild right
parietal scalp soft tissue swelling.

No acute fracture or listhesis of the cervical spine.

## 2022-11-02 IMAGING — CT CT HEAD W/O CM
3 series · 15 of 47 positions shown, 18 images · non-contrast
Comparison: None.

CLINICAL DATA: Head trauma, mod-severe fall, right head laceration,
H/O aneurism; Neck trauma (Age >= 65y). Fall, head injury.

EXAM:
CT HEAD WITHOUT CONTRAST
CT CERVICAL SPINE WITHOUT CONTRAST
TECHNIQUE: Multidetector CT imaging of the head and cervical spine was
performed following the standard protocol without intravenous
contrast. Multiplanar CT image reconstructions of the cervical spine
were also generated.

[Series 2: head wo · axial · 0.44mm/px · z∈[-145,-10]mm · 9 of 33 slices shown, 12 images]
[im 3/33  brain]
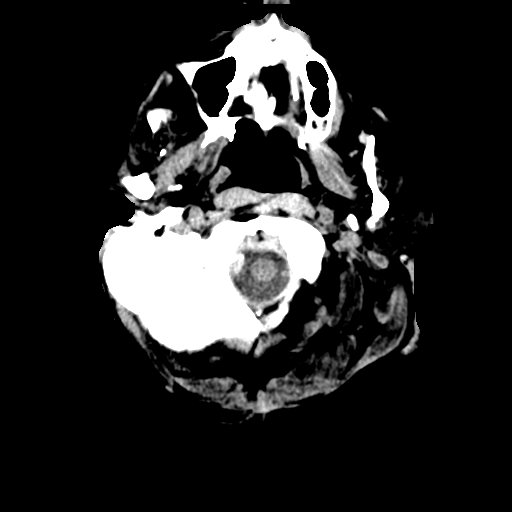
[im 3/33  bone]
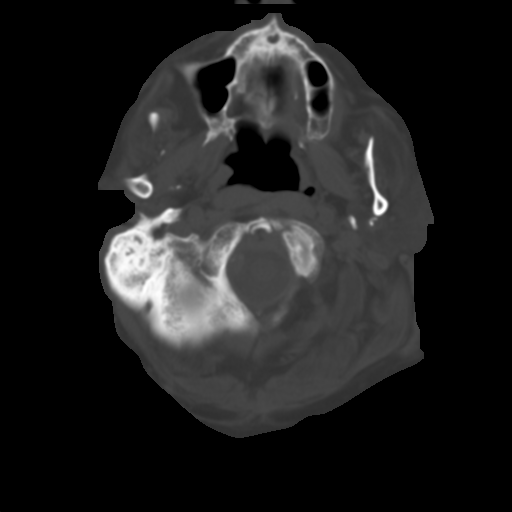
[im 6/33  brain]
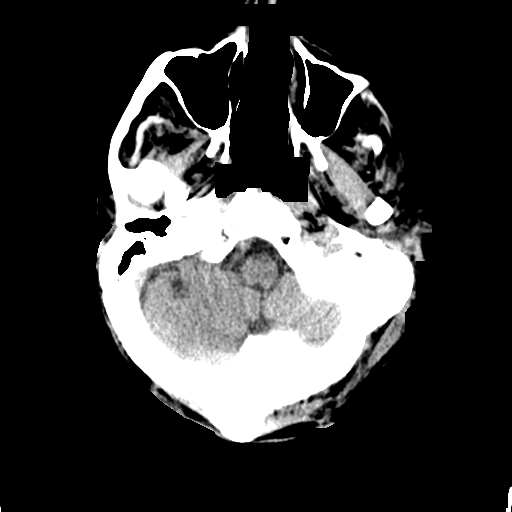
[im 9/33  brain]
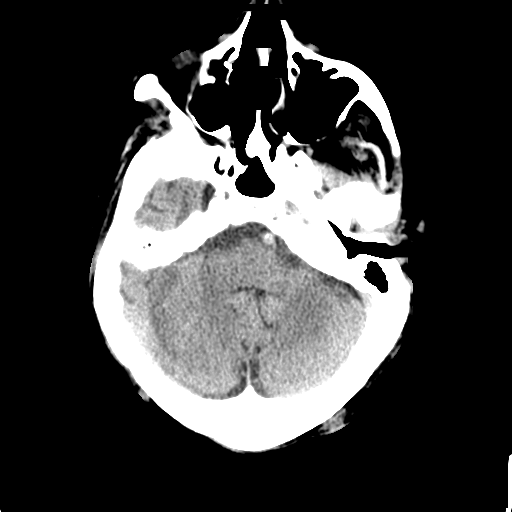
[im 13/33  brain]
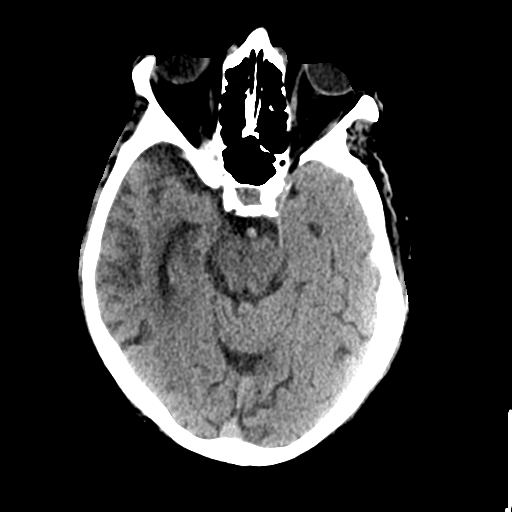
[im 17/33  brain]
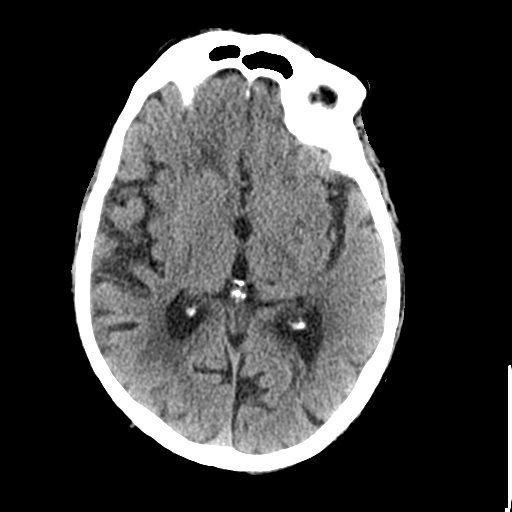
[im 17/33  bone]
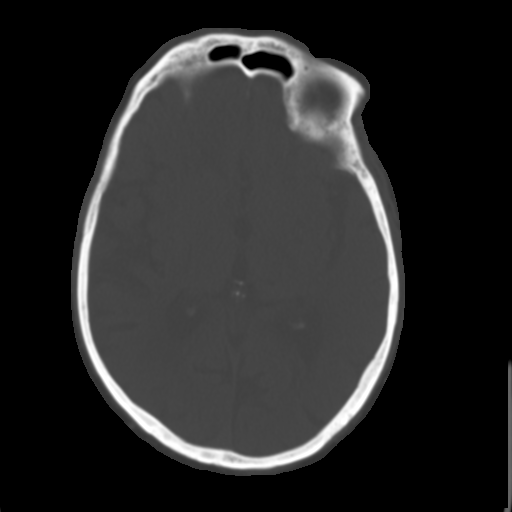
[im 20/33  brain]
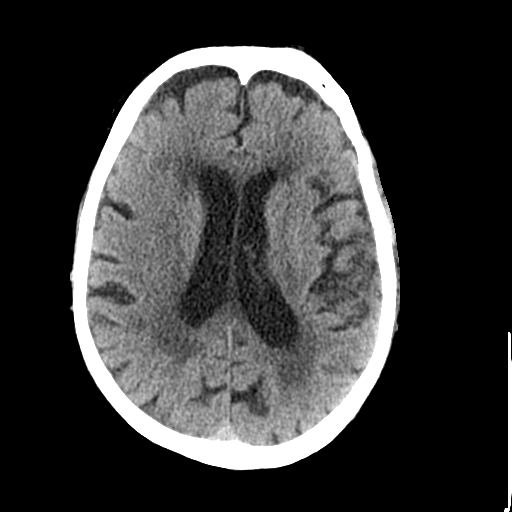
[im 24/33  brain]
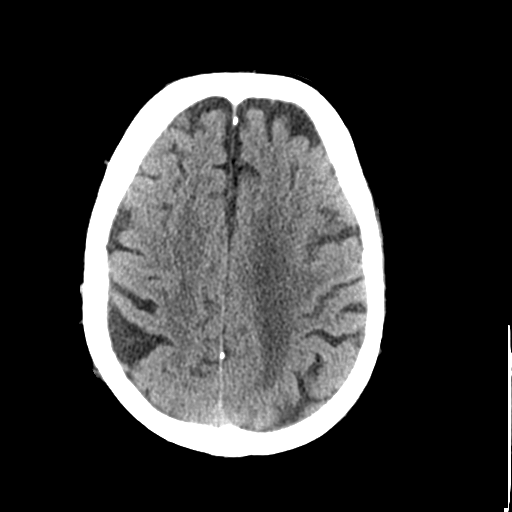
[im 27/33  brain]
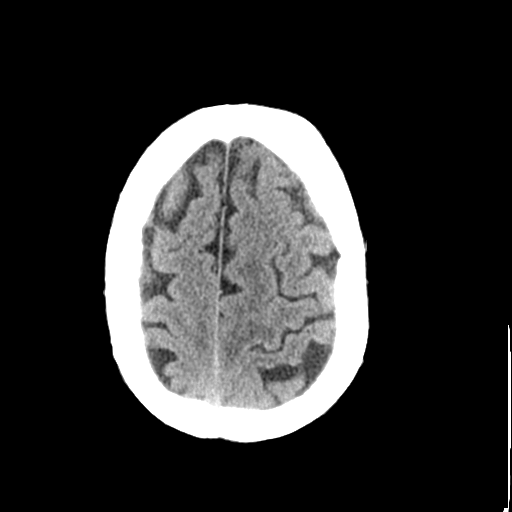
[im 30/33  brain]
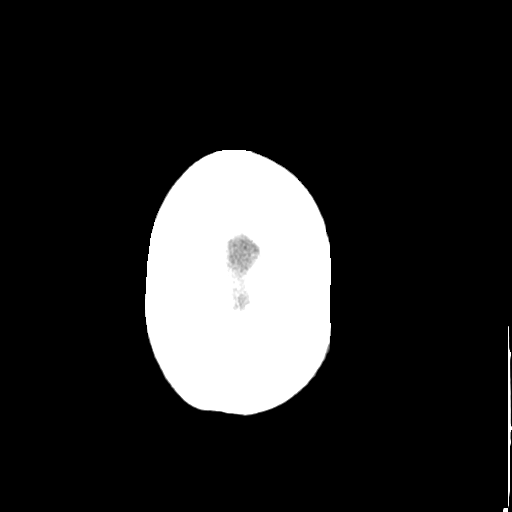
[im 30/33  bone]
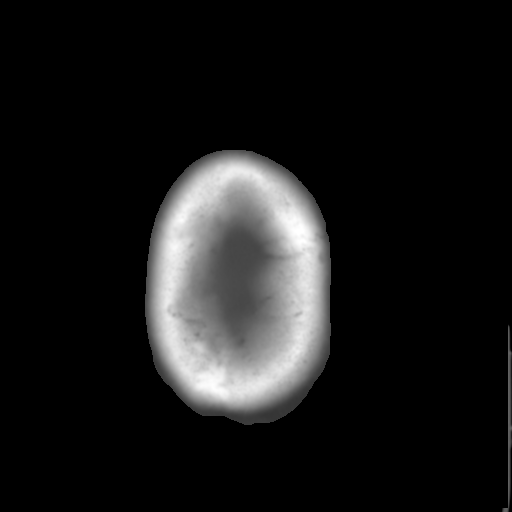

[Series 4: coronal soft tissue · coronal · 0.32mm/px · 3 of 71 slices shown]
[im 24/71  brain]
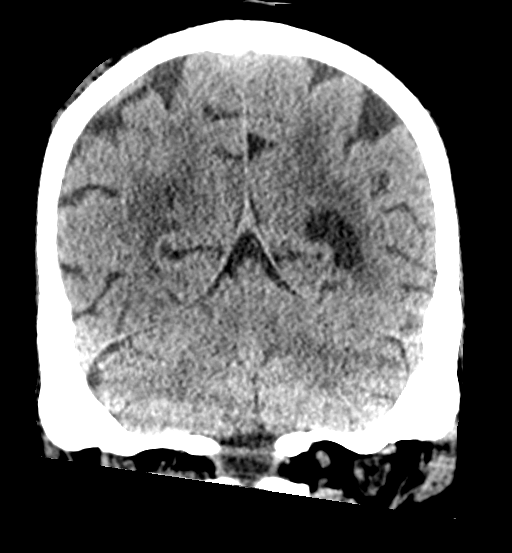
[im 32/71  brain]
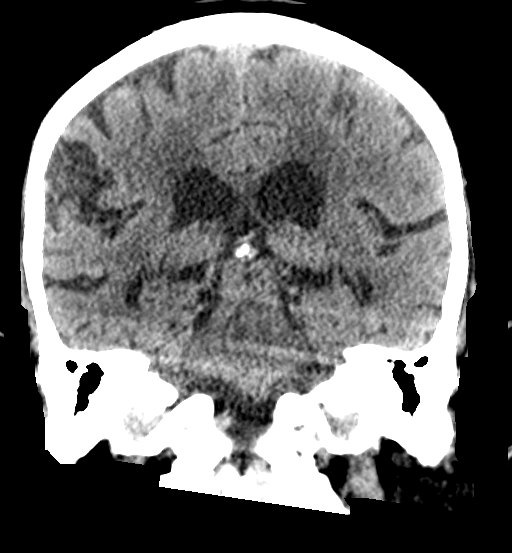
[im 39/71  brain]
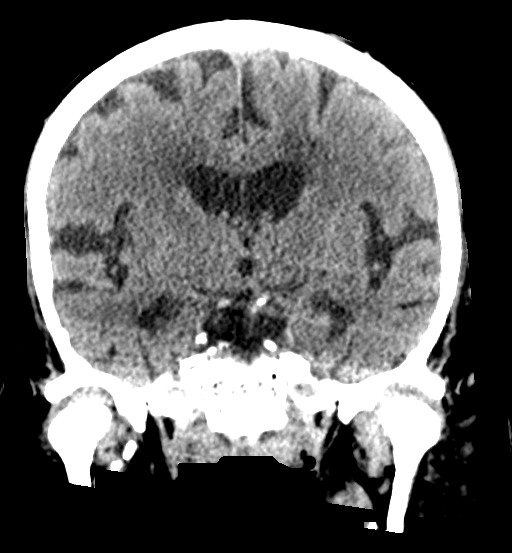

[Series 5: sagittal soft tissue · sagittal · 0.34mm/px · 3 of 55 slices shown]
[im 19/55  brain]
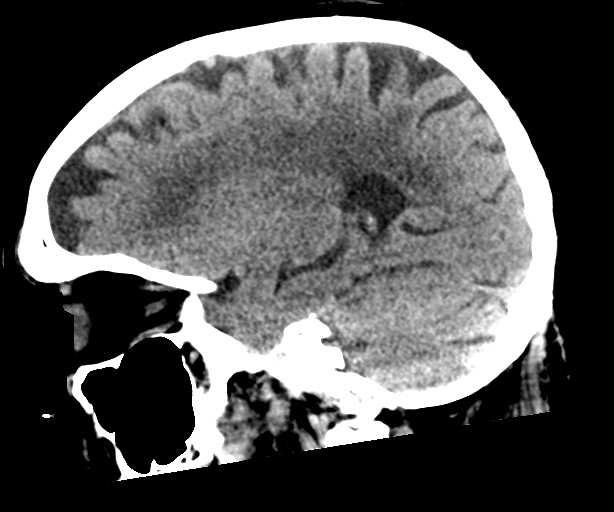
[im 28/55  brain]
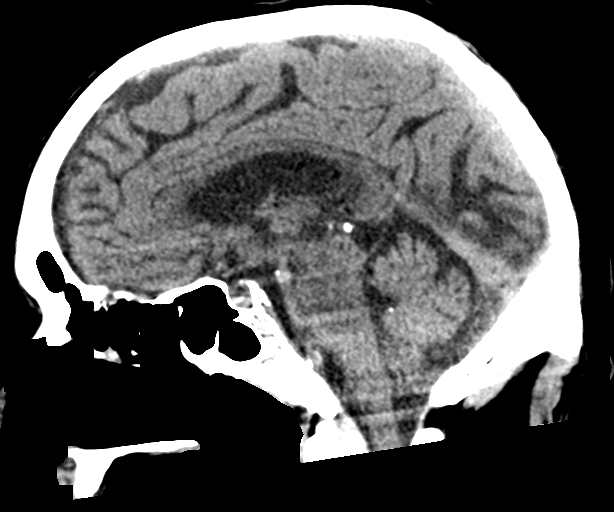
[im 36/55  brain]
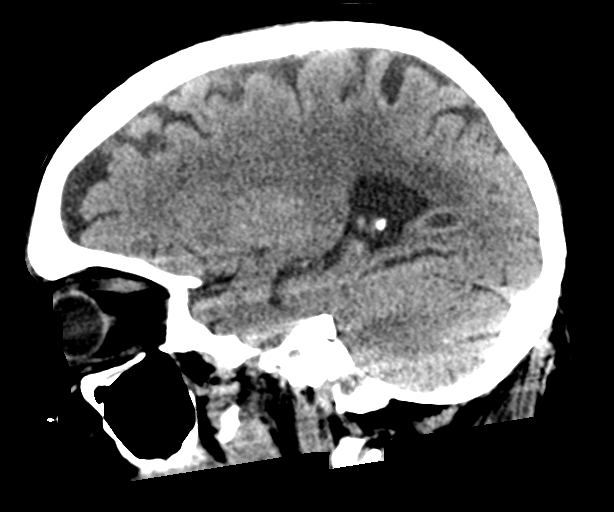

[15 of 47 positions shown; findings below may reference images not displayed]

FINDINGS: CT HEAD FINDINGS

Brain: Normal anatomic configuration. Parenchymal volume loss is
commensurate with the patient's age. Moderate periventricular white
matter changes are present likely reflecting the sequela of small
vessel ischemia. No abnormal intra or extra-axial mass lesion or
fluid collection. No abnormal mass effect or midline shift. No
evidence of acute intracranial hemorrhage or infarct. Ventricular
size is normal. Cerebellum unremarkable.

Vascular: No asymmetric hyperdense vasculature at the skull base.
Advanced vascular calcifications are noted within the carotid
siphons.

Skull: Intact

Sinuses/Orbits: Paranasal sinuses are clear. Orbits are
unremarkable.

Other: Mastoid air cells and middle ear cavities are clear. Mild
right parietal scalp soft tissue swelling.

CT CERVICAL SPINE FINDINGS

Alignment: Normal.

Skull base and vertebrae: Craniocervical alignment is normal. The
atlantodental interval is not widened. No acute fracture of the
cervical spine. Vertebral body height has been preserved.

Soft tissues and spinal canal: No prevertebral fluid or swelling. No
visible canal hematoma. Mild central canal stenosis at C5-6
secondary to posterior disc osteophyte complex with abutment but no
remodeling of the thecal sac.

Disc levels: Intervertebral disc space narrowing and endplate
remodeling at C5-C7 is in keeping with changes of moderate
degenerative disc disease. The prevertebral soft tissues are not
thickened. Review of the axial images demonstrates moderate
bilateral neuroforaminal narrowing at C5-6 secondary to
uncovertebral arthrosis.

Upper chest: Unremarkable

Other: None
IMPRESSION: No acute intracranial abnormality. No calvarial fracture. Mild right
parietal scalp soft tissue swelling.

No acute fracture or listhesis of the cervical spine.
